# Patient Record
Sex: Male | Born: 1990 | Race: White | Hispanic: No | Marital: Single | Smoking: Never smoker
Health system: Southern US, Community
[De-identification: ages and names within clinical notes are randomized; demographics above are authoritative.]

---
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
## 2015-04-17 ENCOUNTER — Emergency Department (HOSPITAL_COMMUNITY)
Admission: EM | Admit: 2015-04-17 | Discharge: 2015-04-17 | Discharge status: Absent without leave | Payer: Self-pay | Source: Home / Self Care

## 2015-04-17 ENCOUNTER — Encounter (HOSPITAL_COMMUNITY): Payer: Self-pay | Admitting: Emergency Medicine

## 2015-04-17 ENCOUNTER — Emergency Department (HOSPITAL_COMMUNITY)
Admission: EM | Admit: 2015-04-17 | Discharge: 2015-04-17 | Disposition: A | Discharge status: Home / Self Care | Payer: 59 | Attending: Emergency Medicine | Admitting: Emergency Medicine

## 2015-04-17 ENCOUNTER — Emergency Department (HOSPITAL_COMMUNITY): Payer: 59

## 2015-04-17 DIAGNOSIS — M62830 Muscle spasm of back: Secondary | ICD-10-CM | POA: Diagnosis not present

## 2015-04-17 DIAGNOSIS — R2689 Other abnormalities of gait and mobility: Secondary | ICD-10-CM | POA: Insufficient documentation

## 2015-04-17 DIAGNOSIS — M546 Pain in thoracic spine: Secondary | ICD-10-CM | POA: Diagnosis not present

## 2015-04-17 DIAGNOSIS — M545 Low back pain, unspecified: Secondary | ICD-10-CM

## 2015-04-17 DIAGNOSIS — R200 Anesthesia of skin: Secondary | ICD-10-CM | POA: Insufficient documentation

## 2015-04-17 DIAGNOSIS — Z880 Allergy status to penicillin: Secondary | ICD-10-CM | POA: Insufficient documentation

## 2015-04-17 DIAGNOSIS — R202 Paresthesia of skin: Secondary | ICD-10-CM | POA: Insufficient documentation

## 2015-04-17 DIAGNOSIS — G8929 Other chronic pain: Secondary | ICD-10-CM | POA: Insufficient documentation

## 2015-04-17 MED ORDER — DICLOFENAC SODIUM 50 MG PO TBEC: 50.0000 mg | DELAYED_RELEASE_TABLET | Freq: Two times a day (BID) | ORAL | Status: AC

## 2015-04-17 MED ORDER — CYCLOBENZAPRINE HCL 10 MG PO TABS: 10.0000 mg | ORAL_TABLET | Freq: Two times a day (BID) | ORAL | Status: AC | PRN

## 2015-04-17 MED ORDER — CYCLOBENZAPRINE HCL 10 MG PO TABS
10.0000 mg | ORAL_TABLET | Freq: Once | ORAL | Status: AC
  Administered 2015-04-17: 10 mg via ORAL
  Filled 2015-04-17: qty 1

## 2015-04-17 NOTE — Discharge Instructions (Signed)
Your CT scan today was normal. Follow up with your doctor. If pain persist you may need to be scheduled for MRI. Do not take the muscle relaxant if driving as it will make you sleepy.

## 2015-04-17 NOTE — ED Notes (Signed)
I gave the patient a large ice pack for his back.

## 2015-04-17 NOTE — ED Notes (Addendum)
Pt reports a 3 year Hx of back pain . On Thursday Pt had a change in back pain after Physical therapy. Pt reports the new SX's are bilateral leg tingling  and worse pain  . Pt also reports new pain up to mid back . PT denies any loss of Bowel  Or  Bladder function. Pt went to his PCP ( DR SUN) . And was sent to ED for assessment.

## 2015-04-17 NOTE — ED Provider Notes (Signed)
CSN: [id number]     Arrival date & time 04/17/15  1358 History  This chart was scribed for non-physician practitioner Kerrie Buffalo, NP working with , Doug Sou, MD by Lyndel Safe, ED Scribe. This patient was seen in room TR07C/TR07C and the patient's care was started at 3:29 PM.   Chief Complaint  Patient presents with  . Back Pain   Patient is a 24 y.o. male presenting with back pain. The history is provided by the patient. No language interpreter was used.  Back Pain Location:  Thoracic spine and lumbar spine Quality:  Stabbing Radiates to:  L thigh, R thigh, L knee and R knee Pain severity:  Moderate Pain is:  Same all the time Onset quality:  Sudden Duration:  5 days Timing:  Constant Progression:  Worsening Chronicity:  New Context: physical stress   Relieved by:  Nothing Worsened by:  Ambulation and movement Ineffective treatments:  Ibuprofen and narcotics Associated symptoms: numbness   Associated symptoms: no dysuria    HPI Comments: Collin Wallace is a 24 y.o. male, with a PMhx of chronic back pain, who presents to the Emergency Department with mother complaining of constant, sharp,needle-like mid to lower back pain onset 5 days ago Pt reports after doing new hip flexion exercises at physical therapy on thursday he has experienced worsening back pain, noting he could not get out of the bed on Friday, with associated BLE tingling and numbness. Pt states the pain is exacerbated on movement, certain positions, and ambulation. 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 He describes the new pain as a sharp, needle like pain that is in his bilateral mid to lower back as opposed to his back pain at baseline that is a numb,dull pain in either one side or the other. He was seen by Urgent Care 2 days ago and prescribed ibuprofen, he has also been taking hydrocodone both with no relief. He was seen by his PCP  Dr. Wynelle Link pta who advised him to come to the ED for further evaluation.The pt has been seen by a chiropractor when he had an unremarkable MRI, orthopedist, and 2 physical therapist for his chronic back pain. Mom notes pt had osteomyelitis as an infant and that doctors advised her it may result in an abnormal gait. Denies loss of bowel or bladder incontinence, or any urinary symptoms.   History reviewed. No pertinent past medical history. History reviewed. No pertinent past surgical history. History reviewed. No pertinent family history. History  Substance Use Topics  . Smoking status: Never Smoker   . Smokeless tobacco: Not on file  . Alcohol Use: No    Review of Systems  Genitourinary: Negative for dysuria, urgency, frequency and difficulty urinating.  Musculoskeletal: Positive for back pain and arthralgias.  Neurological: Positive for numbness.  All other systems reviewed and are negative.  Allergies  Penicillins  Home Medications   Prior to Admission medications   Medication Sig Start Date End Date Taking? Authorizing Provider  cyclobenzaprine (FLEXERIL) 10 MG tablet Take 1 tablet (10 mg total) by mouth 2 (two) times daily as needed for muscle spasms. 04/17/15   Sherryl Valido Orlene Och, NP  diclofenac (VOLTAREN) 50 MG EC tablet Take 1 tablet (50 mg total) by mouth 2 (two) times daily. 04/17/15   Lyliana Dicenso Orlene Och, NP   BP 126/90 mmHg  Pulse 91  Temp(Src) 98.5 F (36.9 C) (Oral)  Resp 14  Ht 6\' 2"  (1.88 m)  Wt 145 lb (65.772 kg)  BMI 18.61 kg/m2  SpO2 100% Physical Exam  Constitutional: He is oriented to person, place, and time. He appears well-developed and well-nourished. No distress.  HENT:  Head: Normocephalic and atraumatic.  Right Ear: External ear normal.  Left Ear: External ear normal.  Eyes: Conjunctivae and EOM are normal. No scleral icterus.  Neck: Normal range of motion. Neck supple.  Cardiovascular: Normal rate, regular rhythm and normal heart sounds.   Pulmonary/Chest:  Effort normal and breath sounds normal. No respiratory distress. He has no wheezes. He has no rales.  Abdominal: Soft. Bowel sounds are normal. There is no tenderness.  Musculoskeletal: Normal range of motion. He exhibits no edema.       Lumbar back: He exhibits tenderness, pain and spasm. He exhibits no deformity and normal pulse. Decreased range of motion: due to pain.       Back:  Neurological: He is alert and oriented to person, place, and time. He has normal strength. No cranial nerve deficit or sensory deficit.  Reflex Scores:      Bicep reflexes are 2+ on the right side and 2+ on the left side.      Brachioradialis reflexes are 2+ on the right side and 2+ on the left side.      Patellar reflexes are 2+ on the right side and 2+ on the left side.      Achilles reflexes are 2+ on the right side and 2+ on the left side. Slight gait deficit noted with right foot which patient's mother states is no new and due to osteomyelitis as a child.    Skin: Skin is warm and dry. No rash noted. No erythema. No pallor.  Psychiatric: He has a normal mood and affect. His behavior is normal.  Nursing note and vitals reviewed.   ED Course  Procedures  DIAGNOSTIC STUDIES: Oxygen Saturation is 100% on RA, normal by my interpretation.    COORDINATION OF CARE: 3:39 PM Discussed treatment plan which includes to order CT lumbar spine. Will also order Flexeril.  Pt and mother acknowledge and agree to plan.   Labs Review Labs Reviewed - No data to display  Imaging Review Ct Lumbar Spine Wo Contrast  04/17/2015   CLINICAL DATA:  Severe back pain since physical therapy last Thursday. Back injury at work.  EXAM: CT LUMBAR SPINE WITHOUT CONTRAST  TECHNIQUE: Multidetector CT imaging of the lumbar spine was performed without intravenous contrast administration. Multiplanar CT image reconstructions were also generated.  COMPARISON:  Lumbar spine radiographs 10/03/2009  FINDINGS: Normal alignment of the lumbar  vertebral bodies. No acute bony findings or bone lesions. Disc spaces are maintained. The facets are normally aligned. No pars defects.  The spinal canal is generous. No disc protrusions, spinal or foraminal stenosis.  The visualized bony pelvis appears normal. The SI joints are normal.  IMPRESSION: Unremarkable lumbar spine CT examination. If symptoms persist MR may be helpful for further evaluation.   Electronically Signed   By: Rudie Meyer M.D.   On: 04/17/2015 17:50     MDM  24 y.o. male with acute on chronic low back pain that started during PT with new exercises of low back and hips. Stable for d/c to follow up with [facility] as planned. Discussed with the patient and his mother clinical and CT findings and plan of care all questioned fully answered. He will return if any problems arise. Will start Flexeril for  muscle spasm and NSAIDS.   Final diagnoses:  Acute exacerbation of chronic low back pain   I personally performed the services described in this documentation, which was scribed in my presence. The recorded information has been reviewed and is accurate.    [address] [place], [place] 04/17/15 2109  Doug Sou, MD 04/17/15 [phone]

## 2015-04-17 NOTE — ED Notes (Signed)
Pt sts lower back pain that is chronic in nature; pt sts some tingling and pain down legs which is new over last week

## 2017-06-03 ENCOUNTER — Other Ambulatory Visit: Payer: Self-pay | Admitting: Urgent Care

## 2017-06-03 ENCOUNTER — Ambulatory Visit
Admission: RE | Admit: 2017-06-03 | Discharge: 2017-06-03 | Disposition: A | Discharge status: Home / Self Care | Payer: 59 | Source: Ambulatory Visit | Attending: Urgent Care | Admitting: Urgent Care

## 2017-06-03 DIAGNOSIS — M25531 Pain in right wrist: Secondary | ICD-10-CM

## 2018-07-13 ENCOUNTER — Other Ambulatory Visit: Payer: Self-pay

## 2018-07-13 ENCOUNTER — Encounter (HOSPITAL_COMMUNITY): Payer: Self-pay | Admitting: Emergency Medicine

## 2018-07-13 ENCOUNTER — Emergency Department (HOSPITAL_COMMUNITY): Payer: 59

## 2018-07-13 ENCOUNTER — Emergency Department (HOSPITAL_COMMUNITY)
Admission: EM | Admit: 2018-07-13 | Discharge: 2018-07-13 | Disposition: A | Discharge status: Home / Self Care | Payer: 59 | Attending: Emergency Medicine | Admitting: Emergency Medicine

## 2018-07-13 DIAGNOSIS — R0602 Shortness of breath: Secondary | ICD-10-CM | POA: Diagnosis present

## 2018-07-13 DIAGNOSIS — J181 Lobar pneumonia, unspecified organism: Secondary | ICD-10-CM | POA: Diagnosis not present

## 2018-07-13 DIAGNOSIS — J189 Pneumonia, unspecified organism: Secondary | ICD-10-CM

## 2018-07-13 LAB — I-STAT CG4 LACTIC ACID, ED
LACTIC ACID, VENOUS: 2.67 mmol/L — AB (ref 0.5–1.9)
Lactic Acid, Venous: 1.8 mmol/L (ref 0.5–1.9)

## 2018-07-13 LAB — CBC WITH DIFFERENTIAL/PLATELET
BASOS ABS: 0 10*3/uL (ref 0.0–0.1)
BASOS PCT: 0 %
Eosinophils Absolute: 0 10*3/uL (ref 0.0–0.7)
Eosinophils Relative: 0 %
HCT: 42.1 % (ref 39.0–52.0)
Hemoglobin: 15.5 g/dL (ref 13.0–17.0)
Lymphocytes Relative: 44 %
Lymphs Abs: 3.1 10*3/uL (ref 0.7–4.0)
MCH: 31.6 pg (ref 26.0–34.0)
MCHC: 36.8 g/dL — ABNORMAL HIGH (ref 30.0–36.0)
MCV: 85.7 fL (ref 78.0–100.0)
MONO ABS: 0.3 10*3/uL (ref 0.1–1.0)
Monocytes Relative: 4 %
Neutro Abs: 3.7 10*3/uL (ref 1.7–7.7)
Neutrophils Relative %: 52 %
Platelets: 350 10*3/uL (ref 150–400)
RBC: 4.91 MIL/uL (ref 4.22–5.81)
RDW: 11.1 % — AB (ref 11.5–15.5)
WBC: 7.1 10*3/uL (ref 4.0–10.5)

## 2018-07-13 LAB — COMPREHENSIVE METABOLIC PANEL
ALBUMIN: 4.2 g/dL (ref 3.5–5.0)
ALT: 20 U/L (ref 0–44)
AST: 29 U/L (ref 15–41)
Alkaline Phosphatase: 66 U/L (ref 38–126)
Anion gap: 13 (ref 5–15)
BUN: 11 mg/dL (ref 6–20)
CHLORIDE: 103 mmol/L (ref 98–111)
CO2: 24 mmol/L (ref 22–32)
Calcium: 9.8 mg/dL (ref 8.9–10.3)
Creatinine, Ser: 1.17 mg/dL (ref 0.61–1.24)
GFR calc Af Amer: >60 mL/min (ref >60)
GFR calc non Af Amer: >60 mL/min (ref >60)
Glucose, Bld: 133 mg/dL — ABNORMAL HIGH (ref 70–99)
POTASSIUM: 3.4 mmol/L — AB (ref 3.5–5.1)
SODIUM: 140 mmol/L (ref 135–145)
Total Bilirubin: 0.9 mg/dL (ref 0.3–1.2)
Total Protein: 7.3 g/dL (ref 6.5–8.1)

## 2018-07-13 MED ORDER — IOPAMIDOL (ISOVUE-370) INJECTION 76%
100.0000 mL | Freq: Once | INTRAVENOUS | Status: AC | PRN
  Administered 2018-07-13: 100 mL via INTRAVENOUS

## 2018-07-13 MED ORDER — IOPAMIDOL (ISOVUE-370) INJECTION 76%
INTRAVENOUS | Status: AC
  Filled 2018-07-13: qty 100

## 2018-07-13 MED ORDER — BENZONATATE 100 MG PO CAPS: 100.0000 mg | ORAL_CAPSULE | Freq: Three times a day (TID) | ORAL | 0 refills | Status: AC

## 2018-07-13 MED ORDER — IPRATROPIUM-ALBUTEROL 0.5-2.5 (3) MG/3ML IN SOLN
3.0000 mL | Freq: Once | RESPIRATORY_TRACT | Status: AC
  Administered 2018-07-13: 3 mL via RESPIRATORY_TRACT
  Filled 2018-07-13: qty 3

## 2018-07-13 MED ORDER — SODIUM CHLORIDE 0.9 % IV BOLUS
1000.0000 mL | Freq: Once | INTRAVENOUS | Status: AC
  Administered 2018-07-13: 1000 mL via INTRAVENOUS

## 2018-07-13 MED ORDER — CEFDINIR 300 MG PO CAPS: 300.0000 mg | ORAL_CAPSULE | Freq: Two times a day (BID) | ORAL | 0 refills | Status: AC

## 2018-07-13 NOTE — ED Notes (Signed)
Patient transported to X-ray 

## 2018-07-13 NOTE — ED Notes (Signed)
Patient transported to CT 

## 2018-07-13 NOTE — ED Notes (Signed)
Pt tolerated PO challenge

## 2018-07-13 NOTE — ED Provider Notes (Signed)
[facility]-EMERGENCY DEPT Provider Note   CSN: [id number] Arrival date & time: 07/13/18  1936     History   Chief Complaint Chief Complaint  Patient presents with  . Shortness of Breath    HPI Collin Wallace is a 27 y.o. male who presents today for evaluation of shortness of breath.  Over the past 16 days he has reportedly had worsening shortness of breath.  He does not have any chest pain.  He reports that he went to his PCP and was given a Z-pack which he took with out improvement.  He has not had a chest x-ray yet, reports that his provider listened to the right lower lobe and thought he had a pneumonia.  He has a mild history of asthma, got an inhaler today that he used twice with out relief.  He denies fever.  He has no leg swelling.  He reports that he has been using juul to vape mint flavored tobacco for the past six month.  He is otherwise healthy.  He had a negative flu test at his PCP.    He has never had shortness of breath like this before.  He denies any recent steroid use.   He says that he has been getting very short of breath with walking, and has to stop because he almost passes out when walking.    HPI  History reviewed. No pertinent past medical history.  There are no active problems to display for this patient.   History reviewed. No pertinent surgical history.      Home Medications    Prior to Admission medications   Medication Sig Start Date End Date Taking? Authorizing Provider  HYDROcodone-homatropine (HYCODAN) 5-1.5 MG/5ML syrup Take 5 mLs by mouth 4 (four) times daily as needed for cough. 07/09/18  Yes [provider]  ibuprofen (ADVIL,MOTRIN) 200 MG tablet Take 200 mg by mouth 2 (two) times daily as needed for fever or headache.   Yes [provider]  VENTOLIN HFA 108 (90 Base) MCG/ACT inhaler Inhale 2 puffs into the lungs. Every 4 too 6 hours as needed for wheezing or SOB 07/13/18  Yes [provider]  benzonatate (TESSALON) 100 MG capsule Take 1 capsule (100 mg total) by mouth every 8 (eight) hours. 07/13/18   Cristina Gong, PA-C  cefdinir (OMNICEF) 300 MG capsule Take 1 capsule (300 mg total) by mouth 2 (two) times daily for 10 days. 07/13/18 07/23/18  Cristina Gong, PA-C  cyclobenzaprine (FLEXERIL) 10 MG tablet Take 1 tablet (10 mg total) by mouth 2 (two) times daily as needed for muscle spasms. Patient not taking: Reported on 07/13/2018 04/17/15   Janne Napoleon, NP  diclofenac (VOLTAREN) 50 MG EC tablet Take 1 tablet (50 mg total) by mouth 2 (two) times daily. Patient not taking: Reported on 07/13/2018 04/17/15   Janne Napoleon, NP    Family History No family history on file.  Social History Social History   Tobacco Use  . Smoking status: Never Smoker  Substance Use Topics  . Alcohol use: No  . Drug use: No     Allergies   Penicillins and Sulfa antibiotics   Review of Systems Review of Systems  Constitutional: Negative for chills and fever.  Respiratory: Positive for cough (Occasional, occasionall productive. ) and shortness of breath.   Cardiovascular: Negative for chest pain, palpitations and leg swelling.  Gastrointestinal: Negative for abdominal pain, nausea and vomiting.  Neurological: Positive for light-headedness (When  walking). Negative for headaches.     Physical Exam Updated Vital Signs BP 113/83 (BP Location: Right Arm)   Pulse 74   Temp 98 F (36.7 C) (Oral)   Resp 14   Ht 6' (1.829 m)   Wt 66.2 kg   SpO2 97%   BMI 19.80 kg/m   Physical Exam  Constitutional: He appears well-developed and well-nourished. No distress.  HENT:  Head: Normocephalic and atraumatic.  Eyes: Conjunctivae are normal. Right eye exhibits no discharge. Left eye exhibits no discharge. No scleral icterus.  Neck: Normal range of motion.  Cardiovascular: Normal rate and regular rhythm.  Pulmonary/Chest: Effort normal. No stridor. Tachypnea noted. No respiratory  distress.  Abdominal: He exhibits no distension.  Musculoskeletal: He exhibits no edema or deformity.  Neurological: He is alert. He exhibits normal muscle tone.  Skin: Skin is warm and dry. He is not diaphoretic.  Psychiatric: He has a normal mood and affect. His behavior is normal.  Nursing note and vitals reviewed.    ED Treatments / Results  Labs (all labs ordered are listed, but only abnormal results are displayed) Labs Reviewed  CBC WITH DIFFERENTIAL/PLATELET - Abnormal; Notable for the following components:      Result Value   MCHC 36.8 (*)    RDW 11.1 (*)    All other components within normal limits  COMPREHENSIVE METABOLIC PANEL - Abnormal; Notable for the following components:   Potassium 3.4 (*)    Glucose, Bld 133 (*)    All other components within normal limits  I-STAT CG4 LACTIC ACID, ED - Abnormal; Notable for the following components:   Lactic Acid, Venous 2.67 (*)    All other components within normal limits  CULTURE, BLOOD (ROUTINE X 2)  CULTURE, BLOOD (ROUTINE X 2)  I-STAT CG4 LACTIC ACID, ED  I-STAT CG4 LACTIC ACID, ED    EKG EKG Interpretation  Date/Time:  Tuesday July 13 2018 19:53:33 EDT Ventricular Rate:  108 PR Interval:    QRS Duration: 84 QT Interval:  328 QTC Calculation: 440 R Axis:   95 Text Interpretation:  Sinus tachycardia Ventricular premature complex Borderline right axis deviation Borderline T wave abnormalities Borderline ST elevation, anterior leads Confirmed by Kennis Carina [phone]) on 07/13/2018 8:15:16 PM   Radiology Dg Chest 2 View  Result Date: 07/13/2018 CLINICAL DATA:  Cough, dyspnea EXAM: CHEST - 2 VIEW COMPARISON:  None. FINDINGS: Normal heart size. Normal mediastinal contour. No pneumothorax. No pleural effusion. Lungs appear clear, with no acute consolidative airspace disease and no pulmonary edema. IMPRESSION: No active cardiopulmonary disease. Electronically Signed   By: Delbert Phenix M.D.   On: 07/13/2018 20:47    Ct Angio Chest Pe W/cm &/or Wo Cm  Result Date: 07/13/2018 CLINICAL DATA:  Shortness of breath.  Recent diagnosis of pneumonia. EXAM: CT ANGIOGRAPHY CHEST WITH CONTRAST TECHNIQUE: Multidetector CT imaging of the chest was performed using the standard protocol during bolus administration of intravenous contrast. Multiplanar CT image reconstructions and MIPs were obtained to evaluate the vascular anatomy. CONTRAST:  100 mL Isovue 370 COMPARISON:  Chest radiograph 07/13/2018 FINDINGS: Cardiovascular: --Pulmonary arteries: Contrast injection is sufficient to demonstrate satisfactory opacification of the pulmonary arteries to the segmental level. There is no pulmonary embolus. The main pulmonary artery is within normal limits for size. --Aorta: Limited opacification of the aorta due to bolus timing optimization for the pulmonary arteries. Conventional 3 vessel aortic branching pattern. The aortic course and caliber are normal. There is no aortic atherosclerosis. --Heart: Normal size.  No pericardial effusion. Mediastinum/Nodes: No mediastinal, hilar or axillary lymphadenopathy. The visualized thyroid and thoracic esophageal course are unremarkable. Lungs/Pleura: No pulmonary nodules or masses. No pleural effusion or pneumothorax. There is an area of faint opacity in the left lower lobe (series 7, image 93). No large consolidation. No focal pleural abnormality. Upper Abdomen: Contrast bolus timing is not optimized for evaluation of the abdominal organs. Within this limitation, the visualized organs of the upper abdomen are normal. Musculoskeletal: No chest wall abnormality. No acute or significant osseous findings. Review of the MIP images confirms the above findings. IMPRESSION: 1. No pulmonary embolus. 2. Faint opacity in the left lower lobe, possibly indicating infection. Lungs are otherwise clear. Electronically Signed   By: Deatra RobinsonKevin  Herman M.D.   On: 07/13/2018 22:06    Procedures Procedures (including  critical care time)  Medications Ordered in ED Medications  ipratropium-albuterol (DUONEB) 0.5-2.5 (3) MG/3ML nebulizer solution 3 mL (3 mLs Nebulization Given 07/13/18 2058)  sodium chloride 0.9 % bolus 1,000 mL (0 mLs Intravenous Stopped 07/13/18 2218)  iopamidol (ISOVUE-370) 76 % injection 100 mL (100 mLs Intravenous Contrast Given 07/13/18 2148)     Initial Impression / Assessment and Plan / ED Course  I have reviewed the triage vital signs and the nursing notes.  Pertinent labs & imaging results that were available during my care of the patient were reviewed by me and considered in my medical decision making (see chart for details).    Patient presents today for evaluation of shortness of breath.  He was recently diagnosed with pneumonia and finished azithromycin, however over the past few days has become significantly worse.  He does report using a vape pen.  CT chest obtained without evidence of PE, obstruction, lung mass, concern for small left lower lobe pneumonia.  Patient was given IV fluids, and a duo neb after which he had a significant improvement in his symptoms, was no longer tachypnic, tachycardic.  He was able to ambulate with out desaturating or becoming short of breath or otherwise symptomatic.  His initial lactic was mildly elevated, however I suspect that this is primarily secondary to tachypnea, rather than sepsis.  Blood cultures were obtained on initial evaluation.    He does have an allergy to PCN listed, this was after he was on sulfa and amoxicillin as a child, and developed a small rash after being on it for 6 months.  He had no anaphylaxis, no mucus membrane involvement.  Discussed with patient and his mother the possible options for treatment including flouroquinolones, who elected for omnicef.  He was given first dose while in the ED.  His labs did not show leukocytosis, or anemia.  He denied any complaints at the time of discharge, stated that he felt much better and  wished to eat.    Return precautions were discussed with patient who states their understanding.  This patient was seen as a shared visit with Dr. Pilar PlateBero.  He has an albuterol inhaler at home and knows how to use it.  At the time of discharge patient denied any unaddressed complaints or concerns.  Patient is agreeable for discharge home.    Final Clinical Impressions(s) / ED Diagnoses   Final diagnoses:  Community acquired pneumonia of left lower lobe of lung (HCC)  SOB (shortness of breath)    ED Discharge Orders         Ordered    cefdinir (OMNICEF) 300 MG capsule  2 times daily     07/13/18 2330  benzonatate (TESSALON) 100 MG capsule  Every 8 hours     07/13/18 2330           Cristina Gong, PA-C 07/13/18 2356    Sabas Sous, MD 07/14/18 [phone]

## 2018-07-13 NOTE — ED Notes (Signed)
Bed: WA17 Expected date:  Expected time:  Means of arrival:  Comments: TRIAGE 2

## 2018-07-13 NOTE — ED Notes (Signed)
Will obtain remaining bloodwork once patient returns from [facility].

## 2018-07-13 NOTE — ED Notes (Signed)
ED Provider at bedside. 

## 2018-07-13 NOTE — Discharge Instructions (Addendum)
Please use your inhaler as needed.  Please make sure you are staying well hydrated.  Your CAT scan showed concern for a small left lower lobe pneumonia.  As we discussed you are being started on Omnicef also called Cefdinir for treatment of this.  We also discussed other treatment options, however you elected to proceed with this.  Your CT scan did not show any evidence of a blood clot, or abnormalities other than the possible left lower lobe pneumonia.  Please do not restart vaping or smoking.    You have blood cultures pending.  If these have bacteria you will be called to return to the ER for antibiotics.  Please follow up with your doctor in the next 1-2 days, or back at the ER sooner if your symptoms worsen.

## 2018-07-13 NOTE — ED Notes (Signed)
PA aware of elevated I-stat lactic results.

## 2018-07-13 NOTE — ED Triage Notes (Signed)
Patient reports productive cough x10 days. Reports seen at clinic and dx with virus, seen at PCP and dx with pneumonia. Reports taking abx as prescribed. C/o SOB since Sunday. Had inhaler called in today and used with no relief. Hx asthma. Denies fevers.

## 2018-07-13 NOTE — ED Notes (Signed)
Ambulated Pt down hallway. Pt stayed at 98% on room air. Pt denied any shortness of breath. Notified [place], [place]

## 2018-07-19 LAB — CULTURE, BLOOD (ROUTINE X 2)
Culture: NO GROWTH
Culture: NO GROWTH
Special Requests: ADEQUATE
Special Requests: ADEQUATE

## 2019-12-12 IMAGING — CT CT ANGIO CHEST
2 of 6 series · 19 of 46 positions shown · IV contrast (ISOVUE)
Comparison: Chest radiograph 07/13/2018

CLINICAL DATA: Shortness of breath.  Recent diagnosis of pneumonia.

EXAM:
CT ANGIOGRAPHY CHEST WITH CONTRAST
TECHNIQUE: Multidetector CT imaging of the chest was performed using the
standard protocol during bolus administration of intravenous
contrast. Multiplanar CT image reconstructions and MIPs were
obtained to evaluate the vascular anatomy.
CONTRAST:  100 mL Isovue 370

[Series 6: thins · axial · 0.70mm/px · z∈[+1462,+1733]mm · 16 of 297 slices shown]
[im 13/297  lung]
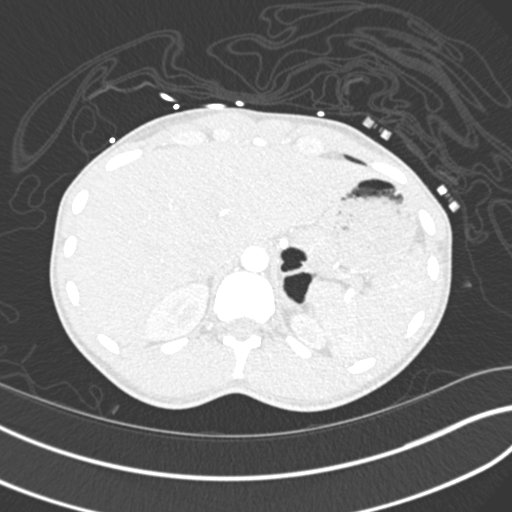
[im 39/297  soft-tissue]
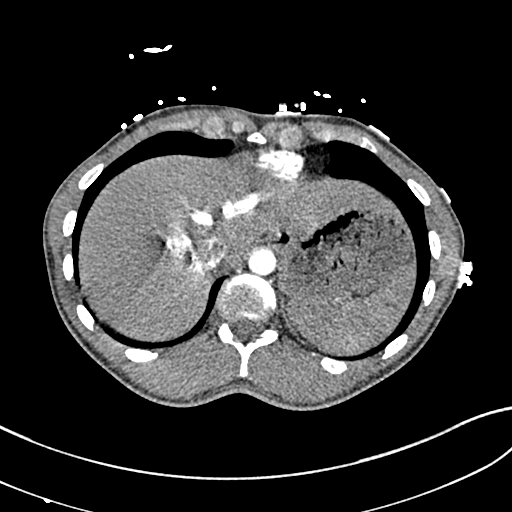
[im 52/297  lung]
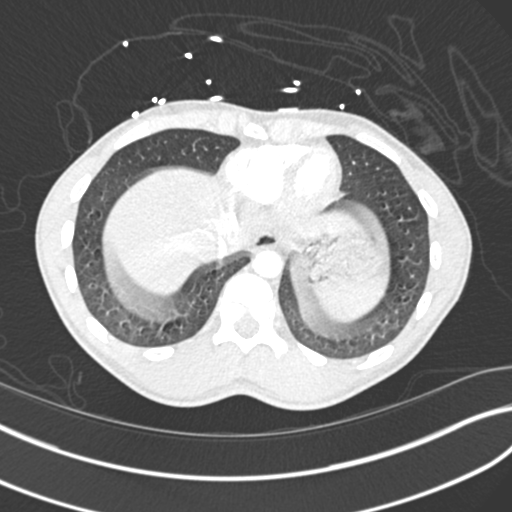
[im 65/297  soft-tissue]
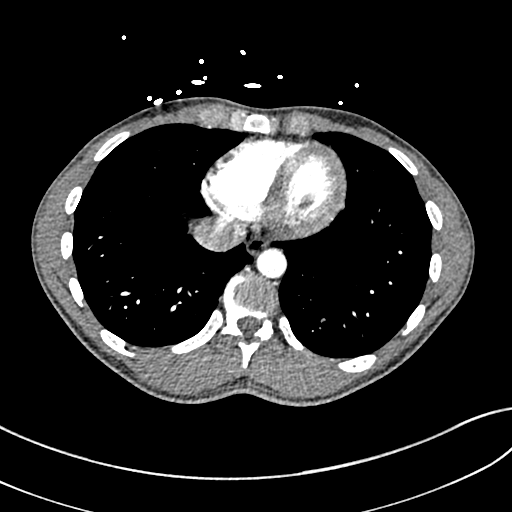
[im 91/297  lung]
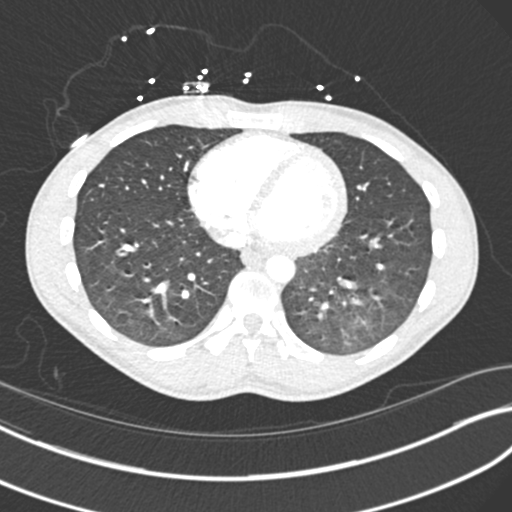
[im 103/297  soft-tissue]
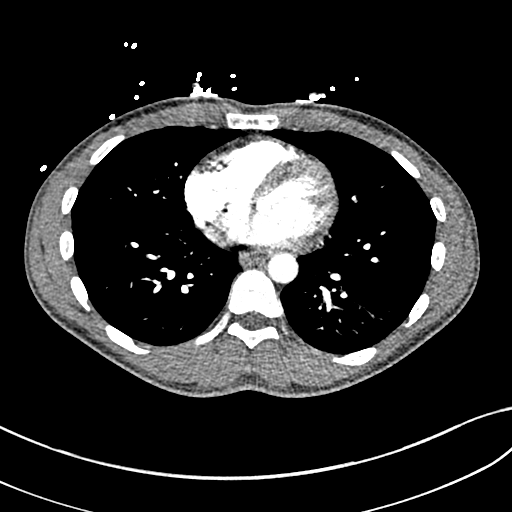
[im 116/297  lung]
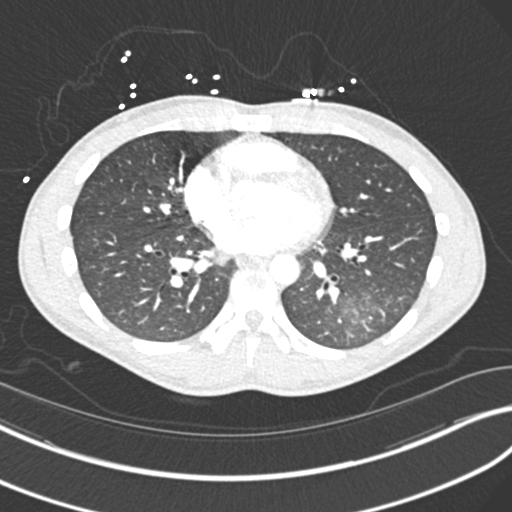
[im 142/297  soft-tissue]
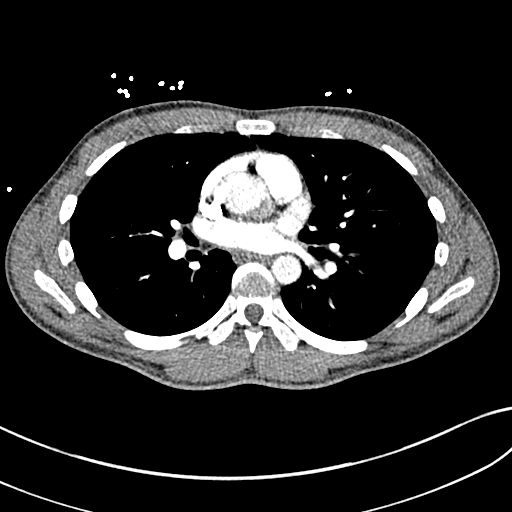
[im 155/297  lung]
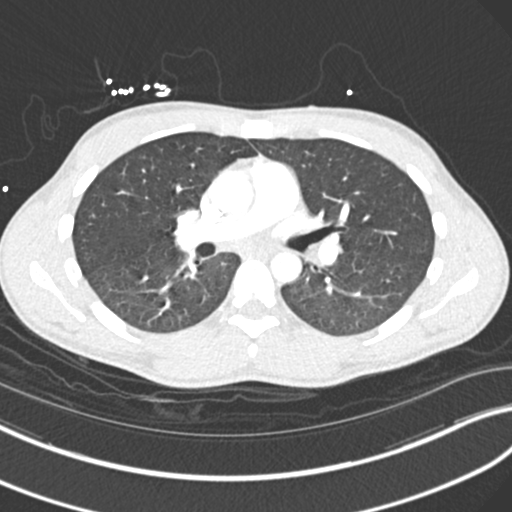
[im 181/297  soft-tissue]
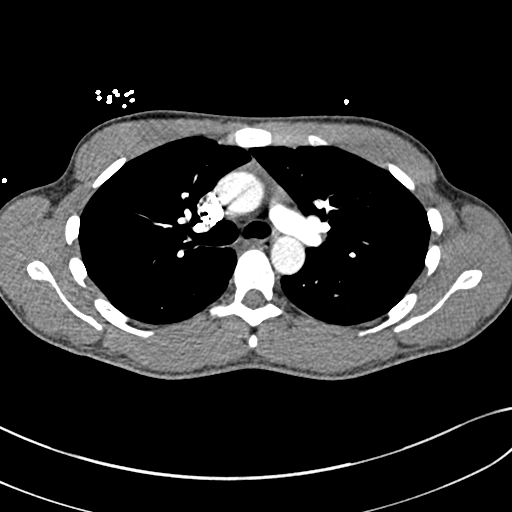
[im 194/297  lung]
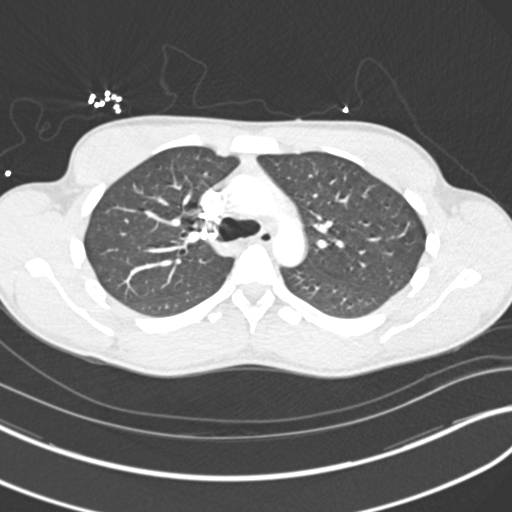
[im 206/297  soft-tissue]
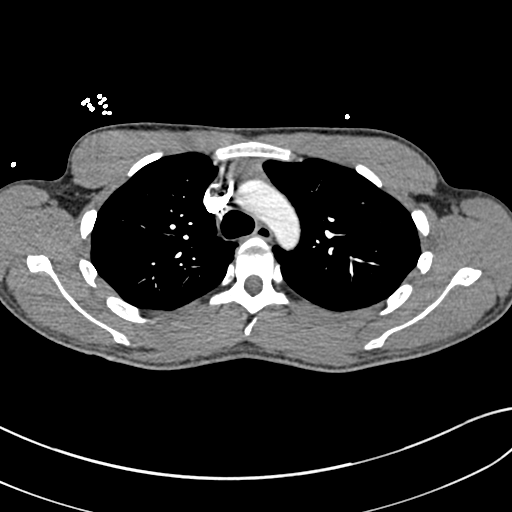
[im 232/297  lung]
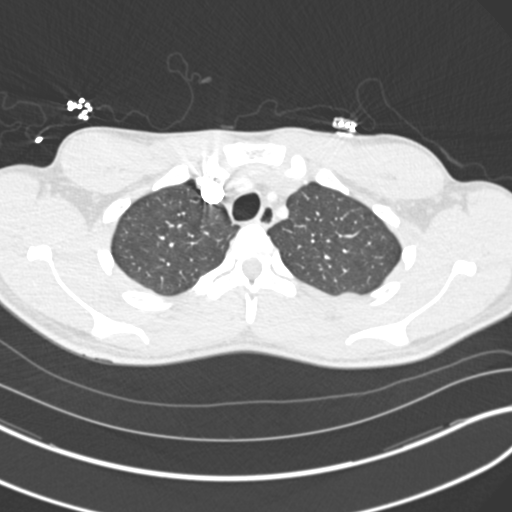
[im 245/297  soft-tissue]
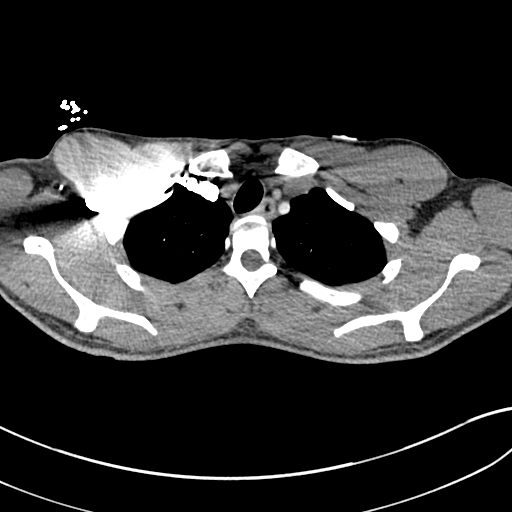
[im 258/297  lung]
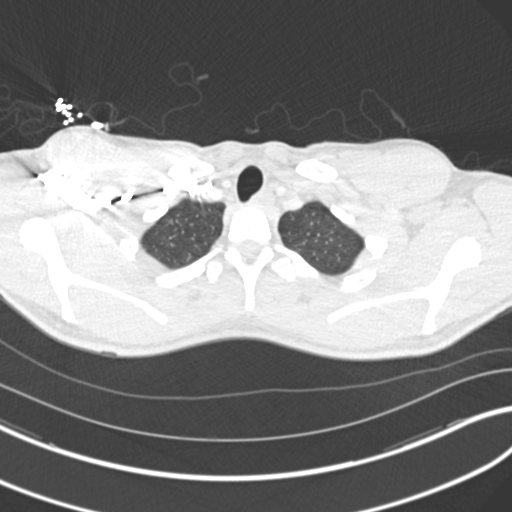
[im 284/297  soft-tissue]
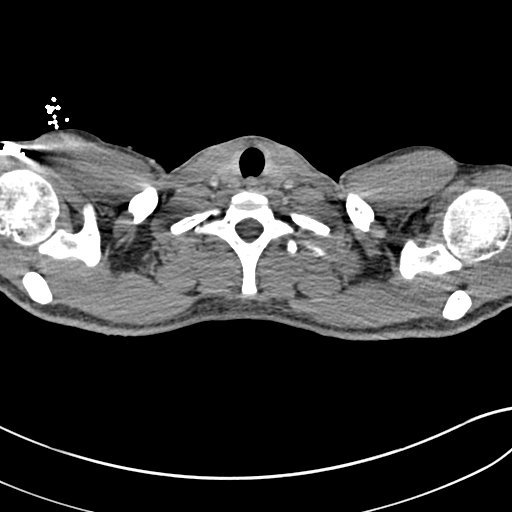

[Series 8: coronal mpr · coronal · 0.60mm/px · 3 of 110 slices shown]
[im 28/110  soft-tissue]
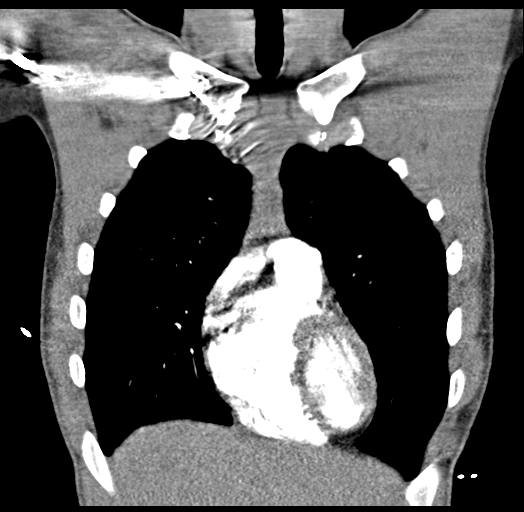
[im 55/110  soft-tissue]
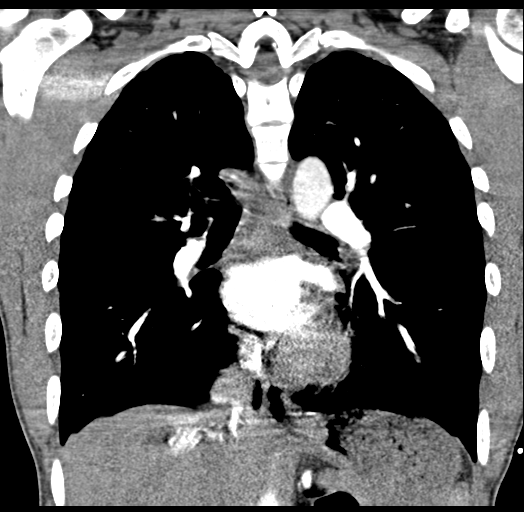
[im 82/110  soft-tissue]
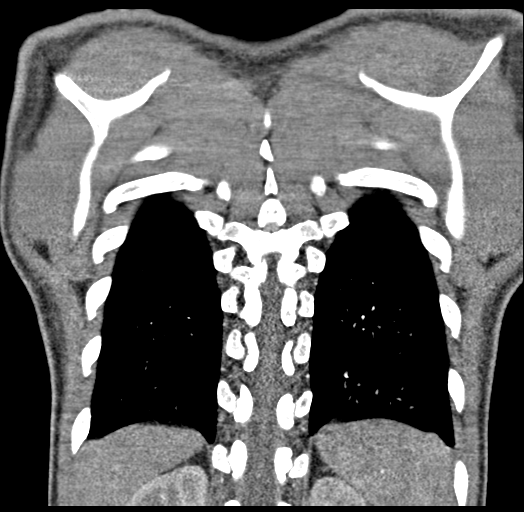

[19 of 46 positions shown; findings below may reference images not displayed]

FINDINGS: Cardiovascular:

--Pulmonary arteries: Contrast injection is sufficient to
demonstrate satisfactory opacification of the pulmonary arteries to
the segmental level. There is no pulmonary embolus. The main
pulmonary artery is within normal limits for size.

--Aorta: Limited opacification of the aorta due to bolus timing
optimization for the pulmonary arteries. Conventional 3 vessel
aortic branching pattern. The aortic course and caliber are normal.
There is no aortic atherosclerosis.

--Heart: Normal size. No pericardial effusion.

Mediastinum/Nodes: No mediastinal, hilar or axillary
lymphadenopathy. The visualized thyroid and thoracic esophageal
course are unremarkable.

Lungs/Pleura: No pulmonary nodules or masses. No pleural effusion or
pneumothorax. There is an area of faint opacity in the left lower
lobe (series 7, image 93). No large consolidation. No focal pleural
abnormality.

Upper Abdomen: Contrast bolus timing is not optimized for evaluation
of the abdominal organs. Within this limitation, the visualized
organs of the upper abdomen are normal.

Musculoskeletal: No chest wall abnormality. No acute or significant
osseous findings.

Review of the MIP images confirms the above findings.
IMPRESSION: 1. No pulmonary embolus.
2. Faint opacity in the left lower lobe, possibly indicating
infection. Lungs are otherwise clear.

## 2020-01-03 DIAGNOSIS — D2239 Melanocytic nevi of other parts of face: Secondary | ICD-10-CM | POA: Diagnosis not present

## 2020-04-04 DIAGNOSIS — Z03818 Encounter for observation for suspected exposure to other biological agents ruled out: Secondary | ICD-10-CM | POA: Diagnosis not present

## 2020-04-04 DIAGNOSIS — M791 Myalgia, unspecified site: Secondary | ICD-10-CM | POA: Diagnosis not present

## 2020-04-04 DIAGNOSIS — R5383 Other fatigue: Secondary | ICD-10-CM | POA: Diagnosis not present

## 2020-05-10 DIAGNOSIS — F1123 Opioid dependence with withdrawal: Secondary | ICD-10-CM | POA: Diagnosis not present

## 2020-05-10 DIAGNOSIS — F329 Major depressive disorder, single episode, unspecified: Secondary | ICD-10-CM | POA: Diagnosis not present

## 2020-05-10 DIAGNOSIS — F17200 Nicotine dependence, unspecified, uncomplicated: Secondary | ICD-10-CM | POA: Diagnosis not present

## 2020-05-14 DIAGNOSIS — F329 Major depressive disorder, single episode, unspecified: Secondary | ICD-10-CM | POA: Diagnosis not present

## 2020-05-14 DIAGNOSIS — F17200 Nicotine dependence, unspecified, uncomplicated: Secondary | ICD-10-CM | POA: Diagnosis not present

## 2020-05-14 DIAGNOSIS — F1123 Opioid dependence with withdrawal: Secondary | ICD-10-CM | POA: Diagnosis not present

## 2020-05-21 DIAGNOSIS — F329 Major depressive disorder, single episode, unspecified: Secondary | ICD-10-CM | POA: Diagnosis not present

## 2020-05-21 DIAGNOSIS — F1123 Opioid dependence with withdrawal: Secondary | ICD-10-CM | POA: Diagnosis not present

## 2020-05-21 DIAGNOSIS — F17200 Nicotine dependence, unspecified, uncomplicated: Secondary | ICD-10-CM | POA: Diagnosis not present

## 2020-05-28 DIAGNOSIS — F1123 Opioid dependence with withdrawal: Secondary | ICD-10-CM | POA: Diagnosis not present

## 2020-05-28 DIAGNOSIS — F329 Major depressive disorder, single episode, unspecified: Secondary | ICD-10-CM | POA: Diagnosis not present

## 2020-05-28 DIAGNOSIS — F17200 Nicotine dependence, unspecified, uncomplicated: Secondary | ICD-10-CM | POA: Diagnosis not present

## 2020-06-11 DIAGNOSIS — F329 Major depressive disorder, single episode, unspecified: Secondary | ICD-10-CM | POA: Diagnosis not present

## 2020-06-11 DIAGNOSIS — F17200 Nicotine dependence, unspecified, uncomplicated: Secondary | ICD-10-CM | POA: Diagnosis not present

## 2020-06-11 DIAGNOSIS — F1123 Opioid dependence with withdrawal: Secondary | ICD-10-CM | POA: Diagnosis not present

## 2020-06-17 DIAGNOSIS — R21 Rash and other nonspecific skin eruption: Secondary | ICD-10-CM | POA: Diagnosis not present

## 2020-06-25 DIAGNOSIS — F329 Major depressive disorder, single episode, unspecified: Secondary | ICD-10-CM | POA: Diagnosis not present

## 2020-06-25 DIAGNOSIS — F17200 Nicotine dependence, unspecified, uncomplicated: Secondary | ICD-10-CM | POA: Diagnosis not present

## 2020-06-25 DIAGNOSIS — F1123 Opioid dependence with withdrawal: Secondary | ICD-10-CM | POA: Diagnosis not present

## 2020-06-27 DIAGNOSIS — Z03818 Encounter for observation for suspected exposure to other biological agents ruled out: Secondary | ICD-10-CM | POA: Diagnosis not present

## 2020-06-27 DIAGNOSIS — R197 Diarrhea, unspecified: Secondary | ICD-10-CM | POA: Diagnosis not present

## 2020-07-09 DIAGNOSIS — F17200 Nicotine dependence, unspecified, uncomplicated: Secondary | ICD-10-CM | POA: Diagnosis not present

## 2020-07-09 DIAGNOSIS — F1123 Opioid dependence with withdrawal: Secondary | ICD-10-CM | POA: Diagnosis not present

## 2020-07-09 DIAGNOSIS — F329 Major depressive disorder, single episode, unspecified: Secondary | ICD-10-CM | POA: Diagnosis not present

## 2020-07-16 DIAGNOSIS — F1123 Opioid dependence with withdrawal: Secondary | ICD-10-CM | POA: Diagnosis not present

## 2020-07-16 DIAGNOSIS — F17200 Nicotine dependence, unspecified, uncomplicated: Secondary | ICD-10-CM | POA: Diagnosis not present

## 2020-07-16 DIAGNOSIS — F329 Major depressive disorder, single episode, unspecified: Secondary | ICD-10-CM | POA: Diagnosis not present

## 2020-07-23 DIAGNOSIS — F17200 Nicotine dependence, unspecified, uncomplicated: Secondary | ICD-10-CM | POA: Diagnosis not present

## 2020-07-23 DIAGNOSIS — F1123 Opioid dependence with withdrawal: Secondary | ICD-10-CM | POA: Diagnosis not present

## 2020-07-23 DIAGNOSIS — F329 Major depressive disorder, single episode, unspecified: Secondary | ICD-10-CM | POA: Diagnosis not present

## 2020-07-26 DIAGNOSIS — D225 Melanocytic nevi of trunk: Secondary | ICD-10-CM | POA: Diagnosis not present

## 2020-07-26 DIAGNOSIS — D2239 Melanocytic nevi of other parts of face: Secondary | ICD-10-CM | POA: Diagnosis not present

## 2020-07-30 DIAGNOSIS — F1123 Opioid dependence with withdrawal: Secondary | ICD-10-CM | POA: Diagnosis not present

## 2020-07-30 DIAGNOSIS — F329 Major depressive disorder, single episode, unspecified: Secondary | ICD-10-CM | POA: Diagnosis not present

## 2020-07-30 DIAGNOSIS — F17200 Nicotine dependence, unspecified, uncomplicated: Secondary | ICD-10-CM | POA: Diagnosis not present

## 2020-08-06 DIAGNOSIS — F1123 Opioid dependence with withdrawal: Secondary | ICD-10-CM | POA: Diagnosis not present

## 2020-08-06 DIAGNOSIS — F329 Major depressive disorder, single episode, unspecified: Secondary | ICD-10-CM | POA: Diagnosis not present

## 2020-08-06 DIAGNOSIS — F17200 Nicotine dependence, unspecified, uncomplicated: Secondary | ICD-10-CM | POA: Diagnosis not present

## 2020-08-13 DIAGNOSIS — F329 Major depressive disorder, single episode, unspecified: Secondary | ICD-10-CM | POA: Diagnosis not present

## 2020-08-13 DIAGNOSIS — F1123 Opioid dependence with withdrawal: Secondary | ICD-10-CM | POA: Diagnosis not present

## 2020-08-13 DIAGNOSIS — F17200 Nicotine dependence, unspecified, uncomplicated: Secondary | ICD-10-CM | POA: Diagnosis not present

## 2020-08-20 DIAGNOSIS — F329 Major depressive disorder, single episode, unspecified: Secondary | ICD-10-CM | POA: Diagnosis not present

## 2020-08-20 DIAGNOSIS — F17200 Nicotine dependence, unspecified, uncomplicated: Secondary | ICD-10-CM | POA: Diagnosis not present

## 2020-08-20 DIAGNOSIS — F1123 Opioid dependence with withdrawal: Secondary | ICD-10-CM | POA: Diagnosis not present

## 2020-08-27 DIAGNOSIS — F17200 Nicotine dependence, unspecified, uncomplicated: Secondary | ICD-10-CM | POA: Diagnosis not present

## 2020-08-27 DIAGNOSIS — F329 Major depressive disorder, single episode, unspecified: Secondary | ICD-10-CM | POA: Diagnosis not present

## 2020-08-27 DIAGNOSIS — F1123 Opioid dependence with withdrawal: Secondary | ICD-10-CM | POA: Diagnosis not present

## 2020-09-03 DIAGNOSIS — F1123 Opioid dependence with withdrawal: Secondary | ICD-10-CM | POA: Diagnosis not present

## 2020-09-03 DIAGNOSIS — F17200 Nicotine dependence, unspecified, uncomplicated: Secondary | ICD-10-CM | POA: Diagnosis not present

## 2020-09-03 DIAGNOSIS — F329 Major depressive disorder, single episode, unspecified: Secondary | ICD-10-CM | POA: Diagnosis not present

## 2020-09-10 DIAGNOSIS — F329 Major depressive disorder, single episode, unspecified: Secondary | ICD-10-CM | POA: Diagnosis not present

## 2020-09-10 DIAGNOSIS — F1123 Opioid dependence with withdrawal: Secondary | ICD-10-CM | POA: Diagnosis not present

## 2020-09-10 DIAGNOSIS — F17200 Nicotine dependence, unspecified, uncomplicated: Secondary | ICD-10-CM | POA: Diagnosis not present

## 2020-09-17 DIAGNOSIS — F329 Major depressive disorder, single episode, unspecified: Secondary | ICD-10-CM | POA: Diagnosis not present

## 2020-09-17 DIAGNOSIS — F1123 Opioid dependence with withdrawal: Secondary | ICD-10-CM | POA: Diagnosis not present

## 2020-09-17 DIAGNOSIS — F17200 Nicotine dependence, unspecified, uncomplicated: Secondary | ICD-10-CM | POA: Diagnosis not present

## 2020-09-24 DIAGNOSIS — F329 Major depressive disorder, single episode, unspecified: Secondary | ICD-10-CM | POA: Diagnosis not present

## 2020-09-24 DIAGNOSIS — F17200 Nicotine dependence, unspecified, uncomplicated: Secondary | ICD-10-CM | POA: Diagnosis not present

## 2020-09-24 DIAGNOSIS — F1123 Opioid dependence with withdrawal: Secondary | ICD-10-CM | POA: Diagnosis not present

## 2020-10-08 DIAGNOSIS — F17200 Nicotine dependence, unspecified, uncomplicated: Secondary | ICD-10-CM | POA: Diagnosis not present

## 2020-10-08 DIAGNOSIS — F329 Major depressive disorder, single episode, unspecified: Secondary | ICD-10-CM | POA: Diagnosis not present

## 2020-10-08 DIAGNOSIS — F1123 Opioid dependence with withdrawal: Secondary | ICD-10-CM | POA: Diagnosis not present

## 2020-10-15 DIAGNOSIS — F17200 Nicotine dependence, unspecified, uncomplicated: Secondary | ICD-10-CM | POA: Diagnosis not present

## 2020-10-15 DIAGNOSIS — F329 Major depressive disorder, single episode, unspecified: Secondary | ICD-10-CM | POA: Diagnosis not present

## 2020-10-15 DIAGNOSIS — F1123 Opioid dependence with withdrawal: Secondary | ICD-10-CM | POA: Diagnosis not present

## 2020-10-29 DIAGNOSIS — F17200 Nicotine dependence, unspecified, uncomplicated: Secondary | ICD-10-CM | POA: Diagnosis not present

## 2020-10-29 DIAGNOSIS — F1123 Opioid dependence with withdrawal: Secondary | ICD-10-CM | POA: Diagnosis not present

## 2020-10-29 DIAGNOSIS — F329 Major depressive disorder, single episode, unspecified: Secondary | ICD-10-CM | POA: Diagnosis not present

## 2020-10-29 DIAGNOSIS — F419 Anxiety disorder, unspecified: Secondary | ICD-10-CM | POA: Diagnosis not present

## 2020-11-05 DIAGNOSIS — F329 Major depressive disorder, single episode, unspecified: Secondary | ICD-10-CM | POA: Diagnosis not present

## 2020-11-05 DIAGNOSIS — F419 Anxiety disorder, unspecified: Secondary | ICD-10-CM | POA: Diagnosis not present

## 2020-11-05 DIAGNOSIS — F1123 Opioid dependence with withdrawal: Secondary | ICD-10-CM | POA: Diagnosis not present

## 2020-11-05 DIAGNOSIS — F17200 Nicotine dependence, unspecified, uncomplicated: Secondary | ICD-10-CM | POA: Diagnosis not present

## 2020-11-19 DIAGNOSIS — F419 Anxiety disorder, unspecified: Secondary | ICD-10-CM | POA: Diagnosis not present

## 2020-11-19 DIAGNOSIS — F1123 Opioid dependence with withdrawal: Secondary | ICD-10-CM | POA: Diagnosis not present

## 2020-11-19 DIAGNOSIS — F17200 Nicotine dependence, unspecified, uncomplicated: Secondary | ICD-10-CM | POA: Diagnosis not present

## 2020-11-19 DIAGNOSIS — F329 Major depressive disorder, single episode, unspecified: Secondary | ICD-10-CM | POA: Diagnosis not present

## 2020-11-23 DIAGNOSIS — L42 Pityriasis rosea: Secondary | ICD-10-CM | POA: Diagnosis not present

## 2020-11-26 DIAGNOSIS — F17200 Nicotine dependence, unspecified, uncomplicated: Secondary | ICD-10-CM | POA: Diagnosis not present

## 2020-11-26 DIAGNOSIS — F329 Major depressive disorder, single episode, unspecified: Secondary | ICD-10-CM | POA: Diagnosis not present

## 2020-11-26 DIAGNOSIS — F419 Anxiety disorder, unspecified: Secondary | ICD-10-CM | POA: Diagnosis not present

## 2020-11-26 DIAGNOSIS — F1123 Opioid dependence with withdrawal: Secondary | ICD-10-CM | POA: Diagnosis not present

## 2020-12-03 DIAGNOSIS — F1123 Opioid dependence with withdrawal: Secondary | ICD-10-CM | POA: Diagnosis not present

## 2020-12-03 DIAGNOSIS — F329 Major depressive disorder, single episode, unspecified: Secondary | ICD-10-CM | POA: Diagnosis not present

## 2020-12-03 DIAGNOSIS — F17200 Nicotine dependence, unspecified, uncomplicated: Secondary | ICD-10-CM | POA: Diagnosis not present

## 2020-12-03 DIAGNOSIS — F419 Anxiety disorder, unspecified: Secondary | ICD-10-CM | POA: Diagnosis not present

## 2020-12-10 DIAGNOSIS — F329 Major depressive disorder, single episode, unspecified: Secondary | ICD-10-CM | POA: Diagnosis not present

## 2020-12-10 DIAGNOSIS — F1123 Opioid dependence with withdrawal: Secondary | ICD-10-CM | POA: Diagnosis not present

## 2020-12-10 DIAGNOSIS — F17200 Nicotine dependence, unspecified, uncomplicated: Secondary | ICD-10-CM | POA: Diagnosis not present

## 2020-12-10 DIAGNOSIS — F419 Anxiety disorder, unspecified: Secondary | ICD-10-CM | POA: Diagnosis not present

## 2020-12-17 DIAGNOSIS — F17200 Nicotine dependence, unspecified, uncomplicated: Secondary | ICD-10-CM | POA: Diagnosis not present

## 2020-12-17 DIAGNOSIS — F419 Anxiety disorder, unspecified: Secondary | ICD-10-CM | POA: Diagnosis not present

## 2020-12-17 DIAGNOSIS — F329 Major depressive disorder, single episode, unspecified: Secondary | ICD-10-CM | POA: Diagnosis not present

## 2020-12-17 DIAGNOSIS — F1123 Opioid dependence with withdrawal: Secondary | ICD-10-CM | POA: Diagnosis not present

## 2020-12-24 DIAGNOSIS — F419 Anxiety disorder, unspecified: Secondary | ICD-10-CM | POA: Diagnosis not present

## 2020-12-24 DIAGNOSIS — F329 Major depressive disorder, single episode, unspecified: Secondary | ICD-10-CM | POA: Diagnosis not present

## 2020-12-24 DIAGNOSIS — F1123 Opioid dependence with withdrawal: Secondary | ICD-10-CM | POA: Diagnosis not present

## 2020-12-24 DIAGNOSIS — F17200 Nicotine dependence, unspecified, uncomplicated: Secondary | ICD-10-CM | POA: Diagnosis not present

## 2020-12-31 DIAGNOSIS — F329 Major depressive disorder, single episode, unspecified: Secondary | ICD-10-CM | POA: Diagnosis not present

## 2020-12-31 DIAGNOSIS — F17200 Nicotine dependence, unspecified, uncomplicated: Secondary | ICD-10-CM | POA: Diagnosis not present

## 2020-12-31 DIAGNOSIS — F419 Anxiety disorder, unspecified: Secondary | ICD-10-CM | POA: Diagnosis not present

## 2020-12-31 DIAGNOSIS — F1123 Opioid dependence with withdrawal: Secondary | ICD-10-CM | POA: Diagnosis not present

## 2021-01-07 DIAGNOSIS — F1123 Opioid dependence with withdrawal: Secondary | ICD-10-CM | POA: Diagnosis not present

## 2021-01-07 DIAGNOSIS — F419 Anxiety disorder, unspecified: Secondary | ICD-10-CM | POA: Diagnosis not present

## 2021-01-07 DIAGNOSIS — F329 Major depressive disorder, single episode, unspecified: Secondary | ICD-10-CM | POA: Diagnosis not present

## 2021-01-07 DIAGNOSIS — F17200 Nicotine dependence, unspecified, uncomplicated: Secondary | ICD-10-CM | POA: Diagnosis not present

## 2021-01-14 DIAGNOSIS — F419 Anxiety disorder, unspecified: Secondary | ICD-10-CM | POA: Diagnosis not present

## 2021-01-14 DIAGNOSIS — F17200 Nicotine dependence, unspecified, uncomplicated: Secondary | ICD-10-CM | POA: Diagnosis not present

## 2021-01-14 DIAGNOSIS — F329 Major depressive disorder, single episode, unspecified: Secondary | ICD-10-CM | POA: Diagnosis not present

## 2021-01-14 DIAGNOSIS — F1123 Opioid dependence with withdrawal: Secondary | ICD-10-CM | POA: Diagnosis not present

## 2021-01-21 DIAGNOSIS — F1123 Opioid dependence with withdrawal: Secondary | ICD-10-CM | POA: Diagnosis not present

## 2021-01-21 DIAGNOSIS — F329 Major depressive disorder, single episode, unspecified: Secondary | ICD-10-CM | POA: Diagnosis not present

## 2021-01-21 DIAGNOSIS — F17200 Nicotine dependence, unspecified, uncomplicated: Secondary | ICD-10-CM | POA: Diagnosis not present

## 2021-01-21 DIAGNOSIS — F419 Anxiety disorder, unspecified: Secondary | ICD-10-CM | POA: Diagnosis not present

## 2021-01-28 DIAGNOSIS — F419 Anxiety disorder, unspecified: Secondary | ICD-10-CM | POA: Diagnosis not present

## 2021-01-28 DIAGNOSIS — F1123 Opioid dependence with withdrawal: Secondary | ICD-10-CM | POA: Diagnosis not present

## 2021-01-28 DIAGNOSIS — F329 Major depressive disorder, single episode, unspecified: Secondary | ICD-10-CM | POA: Diagnosis not present

## 2021-01-28 DIAGNOSIS — F17200 Nicotine dependence, unspecified, uncomplicated: Secondary | ICD-10-CM | POA: Diagnosis not present

## 2021-02-04 DIAGNOSIS — F1123 Opioid dependence with withdrawal: Secondary | ICD-10-CM | POA: Diagnosis not present

## 2021-02-04 DIAGNOSIS — F17200 Nicotine dependence, unspecified, uncomplicated: Secondary | ICD-10-CM | POA: Diagnosis not present

## 2021-02-04 DIAGNOSIS — F419 Anxiety disorder, unspecified: Secondary | ICD-10-CM | POA: Diagnosis not present

## 2021-02-04 DIAGNOSIS — F329 Major depressive disorder, single episode, unspecified: Secondary | ICD-10-CM | POA: Diagnosis not present

## 2021-02-18 DIAGNOSIS — F17200 Nicotine dependence, unspecified, uncomplicated: Secondary | ICD-10-CM | POA: Diagnosis not present

## 2021-02-18 DIAGNOSIS — F329 Major depressive disorder, single episode, unspecified: Secondary | ICD-10-CM | POA: Diagnosis not present

## 2021-02-18 DIAGNOSIS — F1123 Opioid dependence with withdrawal: Secondary | ICD-10-CM | POA: Diagnosis not present

## 2021-02-18 DIAGNOSIS — F419 Anxiety disorder, unspecified: Secondary | ICD-10-CM | POA: Diagnosis not present

## 2021-02-25 DIAGNOSIS — F419 Anxiety disorder, unspecified: Secondary | ICD-10-CM | POA: Diagnosis not present

## 2021-02-25 DIAGNOSIS — F329 Major depressive disorder, single episode, unspecified: Secondary | ICD-10-CM | POA: Diagnosis not present

## 2021-02-25 DIAGNOSIS — F17200 Nicotine dependence, unspecified, uncomplicated: Secondary | ICD-10-CM | POA: Diagnosis not present

## 2021-02-25 DIAGNOSIS — F1123 Opioid dependence with withdrawal: Secondary | ICD-10-CM | POA: Diagnosis not present

## 2021-03-11 DIAGNOSIS — F419 Anxiety disorder, unspecified: Secondary | ICD-10-CM | POA: Diagnosis not present

## 2021-03-11 DIAGNOSIS — F1123 Opioid dependence with withdrawal: Secondary | ICD-10-CM | POA: Diagnosis not present

## 2021-03-11 DIAGNOSIS — F17200 Nicotine dependence, unspecified, uncomplicated: Secondary | ICD-10-CM | POA: Diagnosis not present

## 2021-03-11 DIAGNOSIS — F329 Major depressive disorder, single episode, unspecified: Secondary | ICD-10-CM | POA: Diagnosis not present

## 2021-03-18 DIAGNOSIS — F17200 Nicotine dependence, unspecified, uncomplicated: Secondary | ICD-10-CM | POA: Diagnosis not present

## 2021-03-18 DIAGNOSIS — F329 Major depressive disorder, single episode, unspecified: Secondary | ICD-10-CM | POA: Diagnosis not present

## 2021-03-18 DIAGNOSIS — F1123 Opioid dependence with withdrawal: Secondary | ICD-10-CM | POA: Diagnosis not present

## 2021-03-18 DIAGNOSIS — F419 Anxiety disorder, unspecified: Secondary | ICD-10-CM | POA: Diagnosis not present

## 2021-03-25 DIAGNOSIS — F17200 Nicotine dependence, unspecified, uncomplicated: Secondary | ICD-10-CM | POA: Diagnosis not present

## 2021-03-25 DIAGNOSIS — F329 Major depressive disorder, single episode, unspecified: Secondary | ICD-10-CM | POA: Diagnosis not present

## 2021-03-25 DIAGNOSIS — F1123 Opioid dependence with withdrawal: Secondary | ICD-10-CM | POA: Diagnosis not present

## 2021-03-25 DIAGNOSIS — F419 Anxiety disorder, unspecified: Secondary | ICD-10-CM | POA: Diagnosis not present

## 2021-04-01 DIAGNOSIS — F419 Anxiety disorder, unspecified: Secondary | ICD-10-CM | POA: Diagnosis not present

## 2021-04-01 DIAGNOSIS — F17200 Nicotine dependence, unspecified, uncomplicated: Secondary | ICD-10-CM | POA: Diagnosis not present

## 2021-04-01 DIAGNOSIS — F1123 Opioid dependence with withdrawal: Secondary | ICD-10-CM | POA: Diagnosis not present

## 2021-04-01 DIAGNOSIS — F329 Major depressive disorder, single episode, unspecified: Secondary | ICD-10-CM | POA: Diagnosis not present

## 2021-04-08 DIAGNOSIS — F17200 Nicotine dependence, unspecified, uncomplicated: Secondary | ICD-10-CM | POA: Diagnosis not present

## 2021-04-08 DIAGNOSIS — F329 Major depressive disorder, single episode, unspecified: Secondary | ICD-10-CM | POA: Diagnosis not present

## 2021-04-08 DIAGNOSIS — F419 Anxiety disorder, unspecified: Secondary | ICD-10-CM | POA: Diagnosis not present

## 2021-04-08 DIAGNOSIS — F1123 Opioid dependence with withdrawal: Secondary | ICD-10-CM | POA: Diagnosis not present

## 2021-04-15 DIAGNOSIS — F419 Anxiety disorder, unspecified: Secondary | ICD-10-CM | POA: Diagnosis not present

## 2021-04-15 DIAGNOSIS — F1123 Opioid dependence with withdrawal: Secondary | ICD-10-CM | POA: Diagnosis not present

## 2021-04-15 DIAGNOSIS — F17200 Nicotine dependence, unspecified, uncomplicated: Secondary | ICD-10-CM | POA: Diagnosis not present

## 2021-04-15 DIAGNOSIS — F329 Major depressive disorder, single episode, unspecified: Secondary | ICD-10-CM | POA: Diagnosis not present

## 2021-04-24 DIAGNOSIS — N5312 Painful ejaculation: Secondary | ICD-10-CM | POA: Diagnosis not present

## 2021-04-24 DIAGNOSIS — R3915 Urgency of urination: Secondary | ICD-10-CM | POA: Diagnosis not present

## 2021-04-30 DIAGNOSIS — R509 Fever, unspecified: Secondary | ICD-10-CM | POA: Diagnosis not present

## 2021-04-30 DIAGNOSIS — R059 Cough, unspecified: Secondary | ICD-10-CM | POA: Diagnosis not present

## 2021-04-30 DIAGNOSIS — Z8709 Personal history of other diseases of the respiratory system: Secondary | ICD-10-CM | POA: Diagnosis not present

## 2021-04-30 DIAGNOSIS — U071 COVID-19: Secondary | ICD-10-CM | POA: Diagnosis not present

## 2021-05-06 DIAGNOSIS — F419 Anxiety disorder, unspecified: Secondary | ICD-10-CM | POA: Diagnosis not present

## 2021-05-06 DIAGNOSIS — F329 Major depressive disorder, single episode, unspecified: Secondary | ICD-10-CM | POA: Diagnosis not present

## 2021-05-06 DIAGNOSIS — F1123 Opioid dependence with withdrawal: Secondary | ICD-10-CM | POA: Diagnosis not present

## 2021-05-06 DIAGNOSIS — F17200 Nicotine dependence, unspecified, uncomplicated: Secondary | ICD-10-CM | POA: Diagnosis not present

## 2021-05-13 DIAGNOSIS — F419 Anxiety disorder, unspecified: Secondary | ICD-10-CM | POA: Diagnosis not present

## 2021-05-13 DIAGNOSIS — F329 Major depressive disorder, single episode, unspecified: Secondary | ICD-10-CM | POA: Diagnosis not present

## 2021-05-13 DIAGNOSIS — F1123 Opioid dependence with withdrawal: Secondary | ICD-10-CM | POA: Diagnosis not present

## 2021-05-13 DIAGNOSIS — F17200 Nicotine dependence, unspecified, uncomplicated: Secondary | ICD-10-CM | POA: Diagnosis not present

## 2021-05-20 DIAGNOSIS — F1123 Opioid dependence with withdrawal: Secondary | ICD-10-CM | POA: Diagnosis not present

## 2021-05-20 DIAGNOSIS — F329 Major depressive disorder, single episode, unspecified: Secondary | ICD-10-CM | POA: Diagnosis not present

## 2021-05-20 DIAGNOSIS — F17200 Nicotine dependence, unspecified, uncomplicated: Secondary | ICD-10-CM | POA: Diagnosis not present

## 2021-05-20 DIAGNOSIS — F419 Anxiety disorder, unspecified: Secondary | ICD-10-CM | POA: Diagnosis not present

## 2021-05-27 DIAGNOSIS — F17200 Nicotine dependence, unspecified, uncomplicated: Secondary | ICD-10-CM | POA: Diagnosis not present

## 2021-05-27 DIAGNOSIS — F419 Anxiety disorder, unspecified: Secondary | ICD-10-CM | POA: Diagnosis not present

## 2021-05-27 DIAGNOSIS — F329 Major depressive disorder, single episode, unspecified: Secondary | ICD-10-CM | POA: Diagnosis not present

## 2021-05-27 DIAGNOSIS — F1123 Opioid dependence with withdrawal: Secondary | ICD-10-CM | POA: Diagnosis not present

## 2021-06-03 DIAGNOSIS — F17200 Nicotine dependence, unspecified, uncomplicated: Secondary | ICD-10-CM | POA: Diagnosis not present

## 2021-06-03 DIAGNOSIS — F1123 Opioid dependence with withdrawal: Secondary | ICD-10-CM | POA: Diagnosis not present

## 2021-06-03 DIAGNOSIS — F329 Major depressive disorder, single episode, unspecified: Secondary | ICD-10-CM | POA: Diagnosis not present

## 2021-06-03 DIAGNOSIS — F419 Anxiety disorder, unspecified: Secondary | ICD-10-CM | POA: Diagnosis not present

## 2021-06-24 DIAGNOSIS — F329 Major depressive disorder, single episode, unspecified: Secondary | ICD-10-CM | POA: Diagnosis not present

## 2021-06-24 DIAGNOSIS — F1123 Opioid dependence with withdrawal: Secondary | ICD-10-CM | POA: Diagnosis not present

## 2021-06-24 DIAGNOSIS — F419 Anxiety disorder, unspecified: Secondary | ICD-10-CM | POA: Diagnosis not present

## 2021-06-24 DIAGNOSIS — F17200 Nicotine dependence, unspecified, uncomplicated: Secondary | ICD-10-CM | POA: Diagnosis not present

## 2021-07-08 DIAGNOSIS — F329 Major depressive disorder, single episode, unspecified: Secondary | ICD-10-CM | POA: Diagnosis not present

## 2021-07-08 DIAGNOSIS — F1123 Opioid dependence with withdrawal: Secondary | ICD-10-CM | POA: Diagnosis not present

## 2021-07-08 DIAGNOSIS — F17200 Nicotine dependence, unspecified, uncomplicated: Secondary | ICD-10-CM | POA: Diagnosis not present

## 2021-07-08 DIAGNOSIS — F419 Anxiety disorder, unspecified: Secondary | ICD-10-CM | POA: Diagnosis not present

## 2021-07-15 DIAGNOSIS — F329 Major depressive disorder, single episode, unspecified: Secondary | ICD-10-CM | POA: Diagnosis not present

## 2021-07-15 DIAGNOSIS — F17200 Nicotine dependence, unspecified, uncomplicated: Secondary | ICD-10-CM | POA: Diagnosis not present

## 2021-07-15 DIAGNOSIS — F419 Anxiety disorder, unspecified: Secondary | ICD-10-CM | POA: Diagnosis not present

## 2021-07-15 DIAGNOSIS — F1123 Opioid dependence with withdrawal: Secondary | ICD-10-CM | POA: Diagnosis not present

## 2021-07-22 DIAGNOSIS — F419 Anxiety disorder, unspecified: Secondary | ICD-10-CM | POA: Diagnosis not present

## 2021-07-22 DIAGNOSIS — F329 Major depressive disorder, single episode, unspecified: Secondary | ICD-10-CM | POA: Diagnosis not present

## 2021-07-22 DIAGNOSIS — F1123 Opioid dependence with withdrawal: Secondary | ICD-10-CM | POA: Diagnosis not present

## 2021-07-22 DIAGNOSIS — F17200 Nicotine dependence, unspecified, uncomplicated: Secondary | ICD-10-CM | POA: Diagnosis not present

## 2021-07-29 DIAGNOSIS — F419 Anxiety disorder, unspecified: Secondary | ICD-10-CM | POA: Diagnosis not present

## 2021-07-29 DIAGNOSIS — F329 Major depressive disorder, single episode, unspecified: Secondary | ICD-10-CM | POA: Diagnosis not present

## 2021-07-29 DIAGNOSIS — F17200 Nicotine dependence, unspecified, uncomplicated: Secondary | ICD-10-CM | POA: Diagnosis not present

## 2021-07-29 DIAGNOSIS — F1123 Opioid dependence with withdrawal: Secondary | ICD-10-CM | POA: Diagnosis not present

## 2021-08-05 DIAGNOSIS — F1123 Opioid dependence with withdrawal: Secondary | ICD-10-CM | POA: Diagnosis not present

## 2021-08-05 DIAGNOSIS — F329 Major depressive disorder, single episode, unspecified: Secondary | ICD-10-CM | POA: Diagnosis not present

## 2021-08-05 DIAGNOSIS — F17200 Nicotine dependence, unspecified, uncomplicated: Secondary | ICD-10-CM | POA: Diagnosis not present

## 2021-08-05 DIAGNOSIS — F419 Anxiety disorder, unspecified: Secondary | ICD-10-CM | POA: Diagnosis not present

## 2021-08-12 DIAGNOSIS — F419 Anxiety disorder, unspecified: Secondary | ICD-10-CM | POA: Diagnosis not present

## 2021-08-12 DIAGNOSIS — F17200 Nicotine dependence, unspecified, uncomplicated: Secondary | ICD-10-CM | POA: Diagnosis not present

## 2021-08-12 DIAGNOSIS — F329 Major depressive disorder, single episode, unspecified: Secondary | ICD-10-CM | POA: Diagnosis not present

## 2021-08-12 DIAGNOSIS — F1123 Opioid dependence with withdrawal: Secondary | ICD-10-CM | POA: Diagnosis not present

## 2021-08-19 DIAGNOSIS — F329 Major depressive disorder, single episode, unspecified: Secondary | ICD-10-CM | POA: Diagnosis not present

## 2021-08-19 DIAGNOSIS — F419 Anxiety disorder, unspecified: Secondary | ICD-10-CM | POA: Diagnosis not present

## 2021-08-19 DIAGNOSIS — F1123 Opioid dependence with withdrawal: Secondary | ICD-10-CM | POA: Diagnosis not present

## 2021-08-19 DIAGNOSIS — F17200 Nicotine dependence, unspecified, uncomplicated: Secondary | ICD-10-CM | POA: Diagnosis not present

## 2021-08-26 DIAGNOSIS — F17200 Nicotine dependence, unspecified, uncomplicated: Secondary | ICD-10-CM | POA: Diagnosis not present

## 2021-08-26 DIAGNOSIS — F419 Anxiety disorder, unspecified: Secondary | ICD-10-CM | POA: Diagnosis not present

## 2021-08-26 DIAGNOSIS — F1123 Opioid dependence with withdrawal: Secondary | ICD-10-CM | POA: Diagnosis not present

## 2021-08-26 DIAGNOSIS — F329 Major depressive disorder, single episode, unspecified: Secondary | ICD-10-CM | POA: Diagnosis not present

## 2021-09-02 DIAGNOSIS — F1123 Opioid dependence with withdrawal: Secondary | ICD-10-CM | POA: Diagnosis not present

## 2021-09-02 DIAGNOSIS — F17200 Nicotine dependence, unspecified, uncomplicated: Secondary | ICD-10-CM | POA: Diagnosis not present

## 2021-09-02 DIAGNOSIS — F329 Major depressive disorder, single episode, unspecified: Secondary | ICD-10-CM | POA: Diagnosis not present

## 2021-09-02 DIAGNOSIS — F419 Anxiety disorder, unspecified: Secondary | ICD-10-CM | POA: Diagnosis not present

## 2021-09-09 DIAGNOSIS — F1123 Opioid dependence with withdrawal: Secondary | ICD-10-CM | POA: Diagnosis not present

## 2021-09-09 DIAGNOSIS — F419 Anxiety disorder, unspecified: Secondary | ICD-10-CM | POA: Diagnosis not present

## 2021-09-09 DIAGNOSIS — F329 Major depressive disorder, single episode, unspecified: Secondary | ICD-10-CM | POA: Diagnosis not present

## 2021-09-09 DIAGNOSIS — F17200 Nicotine dependence, unspecified, uncomplicated: Secondary | ICD-10-CM | POA: Diagnosis not present

## 2021-09-16 DIAGNOSIS — F329 Major depressive disorder, single episode, unspecified: Secondary | ICD-10-CM | POA: Diagnosis not present

## 2021-09-16 DIAGNOSIS — F17200 Nicotine dependence, unspecified, uncomplicated: Secondary | ICD-10-CM | POA: Diagnosis not present

## 2021-09-16 DIAGNOSIS — F1123 Opioid dependence with withdrawal: Secondary | ICD-10-CM | POA: Diagnosis not present

## 2021-09-16 DIAGNOSIS — F419 Anxiety disorder, unspecified: Secondary | ICD-10-CM | POA: Diagnosis not present

## 2021-09-30 DIAGNOSIS — F329 Major depressive disorder, single episode, unspecified: Secondary | ICD-10-CM | POA: Diagnosis not present

## 2021-09-30 DIAGNOSIS — F419 Anxiety disorder, unspecified: Secondary | ICD-10-CM | POA: Diagnosis not present

## 2021-09-30 DIAGNOSIS — F17200 Nicotine dependence, unspecified, uncomplicated: Secondary | ICD-10-CM | POA: Diagnosis not present

## 2021-09-30 DIAGNOSIS — F1123 Opioid dependence with withdrawal: Secondary | ICD-10-CM | POA: Diagnosis not present

## 2021-10-07 DIAGNOSIS — F1123 Opioid dependence with withdrawal: Secondary | ICD-10-CM | POA: Diagnosis not present

## 2021-10-07 DIAGNOSIS — F17200 Nicotine dependence, unspecified, uncomplicated: Secondary | ICD-10-CM | POA: Diagnosis not present

## 2021-10-07 DIAGNOSIS — F329 Major depressive disorder, single episode, unspecified: Secondary | ICD-10-CM | POA: Diagnosis not present

## 2021-10-07 DIAGNOSIS — F419 Anxiety disorder, unspecified: Secondary | ICD-10-CM | POA: Diagnosis not present

## 2021-11-04 DIAGNOSIS — F1123 Opioid dependence with withdrawal: Secondary | ICD-10-CM | POA: Diagnosis not present

## 2021-11-04 DIAGNOSIS — F419 Anxiety disorder, unspecified: Secondary | ICD-10-CM | POA: Diagnosis not present

## 2021-11-04 DIAGNOSIS — F329 Major depressive disorder, single episode, unspecified: Secondary | ICD-10-CM | POA: Diagnosis not present

## 2021-11-04 DIAGNOSIS — F17200 Nicotine dependence, unspecified, uncomplicated: Secondary | ICD-10-CM | POA: Diagnosis not present

## 2021-11-11 DIAGNOSIS — F419 Anxiety disorder, unspecified: Secondary | ICD-10-CM | POA: Diagnosis not present

## 2021-11-11 DIAGNOSIS — F329 Major depressive disorder, single episode, unspecified: Secondary | ICD-10-CM | POA: Diagnosis not present

## 2021-11-11 DIAGNOSIS — F17200 Nicotine dependence, unspecified, uncomplicated: Secondary | ICD-10-CM | POA: Diagnosis not present

## 2021-11-11 DIAGNOSIS — F1123 Opioid dependence with withdrawal: Secondary | ICD-10-CM | POA: Diagnosis not present

## 2021-11-18 DIAGNOSIS — F17200 Nicotine dependence, unspecified, uncomplicated: Secondary | ICD-10-CM | POA: Diagnosis not present

## 2021-11-18 DIAGNOSIS — F1123 Opioid dependence with withdrawal: Secondary | ICD-10-CM | POA: Diagnosis not present

## 2021-11-18 DIAGNOSIS — F329 Major depressive disorder, single episode, unspecified: Secondary | ICD-10-CM | POA: Diagnosis not present

## 2021-11-18 DIAGNOSIS — F419 Anxiety disorder, unspecified: Secondary | ICD-10-CM | POA: Diagnosis not present

## 2021-11-25 DIAGNOSIS — F329 Major depressive disorder, single episode, unspecified: Secondary | ICD-10-CM | POA: Diagnosis not present

## 2021-11-25 DIAGNOSIS — F17200 Nicotine dependence, unspecified, uncomplicated: Secondary | ICD-10-CM | POA: Diagnosis not present

## 2021-11-25 DIAGNOSIS — F1123 Opioid dependence with withdrawal: Secondary | ICD-10-CM | POA: Diagnosis not present

## 2021-11-25 DIAGNOSIS — F419 Anxiety disorder, unspecified: Secondary | ICD-10-CM | POA: Diagnosis not present

## 2021-12-09 DIAGNOSIS — F17200 Nicotine dependence, unspecified, uncomplicated: Secondary | ICD-10-CM | POA: Diagnosis not present

## 2021-12-09 DIAGNOSIS — F1123 Opioid dependence with withdrawal: Secondary | ICD-10-CM | POA: Diagnosis not present

## 2021-12-09 DIAGNOSIS — F419 Anxiety disorder, unspecified: Secondary | ICD-10-CM | POA: Diagnosis not present

## 2021-12-09 DIAGNOSIS — F329 Major depressive disorder, single episode, unspecified: Secondary | ICD-10-CM | POA: Diagnosis not present

## 2021-12-16 DIAGNOSIS — F329 Major depressive disorder, single episode, unspecified: Secondary | ICD-10-CM | POA: Diagnosis not present

## 2021-12-16 DIAGNOSIS — F419 Anxiety disorder, unspecified: Secondary | ICD-10-CM | POA: Diagnosis not present

## 2021-12-16 DIAGNOSIS — F1123 Opioid dependence with withdrawal: Secondary | ICD-10-CM | POA: Diagnosis not present

## 2021-12-16 DIAGNOSIS — F17200 Nicotine dependence, unspecified, uncomplicated: Secondary | ICD-10-CM | POA: Diagnosis not present

## 2021-12-23 DIAGNOSIS — F17200 Nicotine dependence, unspecified, uncomplicated: Secondary | ICD-10-CM | POA: Diagnosis not present

## 2021-12-23 DIAGNOSIS — F329 Major depressive disorder, single episode, unspecified: Secondary | ICD-10-CM | POA: Diagnosis not present

## 2021-12-23 DIAGNOSIS — F419 Anxiety disorder, unspecified: Secondary | ICD-10-CM | POA: Diagnosis not present

## 2021-12-23 DIAGNOSIS — F1123 Opioid dependence with withdrawal: Secondary | ICD-10-CM | POA: Diagnosis not present

## 2021-12-30 DIAGNOSIS — F419 Anxiety disorder, unspecified: Secondary | ICD-10-CM | POA: Diagnosis not present

## 2021-12-30 DIAGNOSIS — F329 Major depressive disorder, single episode, unspecified: Secondary | ICD-10-CM | POA: Diagnosis not present

## 2021-12-30 DIAGNOSIS — F17200 Nicotine dependence, unspecified, uncomplicated: Secondary | ICD-10-CM | POA: Diagnosis not present

## 2021-12-30 DIAGNOSIS — F1123 Opioid dependence with withdrawal: Secondary | ICD-10-CM | POA: Diagnosis not present

## 2022-01-06 DIAGNOSIS — F17200 Nicotine dependence, unspecified, uncomplicated: Secondary | ICD-10-CM | POA: Diagnosis not present

## 2022-01-06 DIAGNOSIS — F1123 Opioid dependence with withdrawal: Secondary | ICD-10-CM | POA: Diagnosis not present

## 2022-01-06 DIAGNOSIS — F419 Anxiety disorder, unspecified: Secondary | ICD-10-CM | POA: Diagnosis not present

## 2022-01-06 DIAGNOSIS — F329 Major depressive disorder, single episode, unspecified: Secondary | ICD-10-CM | POA: Diagnosis not present

## 2022-01-13 DIAGNOSIS — F1123 Opioid dependence with withdrawal: Secondary | ICD-10-CM | POA: Diagnosis not present

## 2022-01-13 DIAGNOSIS — F17200 Nicotine dependence, unspecified, uncomplicated: Secondary | ICD-10-CM | POA: Diagnosis not present

## 2022-01-13 DIAGNOSIS — F329 Major depressive disorder, single episode, unspecified: Secondary | ICD-10-CM | POA: Diagnosis not present

## 2022-01-13 DIAGNOSIS — F419 Anxiety disorder, unspecified: Secondary | ICD-10-CM | POA: Diagnosis not present

## 2022-01-20 DIAGNOSIS — F419 Anxiety disorder, unspecified: Secondary | ICD-10-CM | POA: Diagnosis not present

## 2022-01-20 DIAGNOSIS — F17200 Nicotine dependence, unspecified, uncomplicated: Secondary | ICD-10-CM | POA: Diagnosis not present

## 2022-01-20 DIAGNOSIS — F1123 Opioid dependence with withdrawal: Secondary | ICD-10-CM | POA: Diagnosis not present

## 2022-01-20 DIAGNOSIS — F329 Major depressive disorder, single episode, unspecified: Secondary | ICD-10-CM | POA: Diagnosis not present

## 2022-01-27 DIAGNOSIS — F419 Anxiety disorder, unspecified: Secondary | ICD-10-CM | POA: Diagnosis not present

## 2022-01-27 DIAGNOSIS — F17200 Nicotine dependence, unspecified, uncomplicated: Secondary | ICD-10-CM | POA: Diagnosis not present

## 2022-01-27 DIAGNOSIS — F329 Major depressive disorder, single episode, unspecified: Secondary | ICD-10-CM | POA: Diagnosis not present

## 2022-01-27 DIAGNOSIS — F1123 Opioid dependence with withdrawal: Secondary | ICD-10-CM | POA: Diagnosis not present

## 2022-02-03 DIAGNOSIS — F1123 Opioid dependence with withdrawal: Secondary | ICD-10-CM | POA: Diagnosis not present

## 2022-02-03 DIAGNOSIS — F419 Anxiety disorder, unspecified: Secondary | ICD-10-CM | POA: Diagnosis not present

## 2022-02-03 DIAGNOSIS — F17200 Nicotine dependence, unspecified, uncomplicated: Secondary | ICD-10-CM | POA: Diagnosis not present

## 2022-02-03 DIAGNOSIS — F329 Major depressive disorder, single episode, unspecified: Secondary | ICD-10-CM | POA: Diagnosis not present

## 2022-02-10 DIAGNOSIS — F419 Anxiety disorder, unspecified: Secondary | ICD-10-CM | POA: Diagnosis not present

## 2022-02-10 DIAGNOSIS — F1123 Opioid dependence with withdrawal: Secondary | ICD-10-CM | POA: Diagnosis not present

## 2022-02-10 DIAGNOSIS — F17200 Nicotine dependence, unspecified, uncomplicated: Secondary | ICD-10-CM | POA: Diagnosis not present

## 2022-02-10 DIAGNOSIS — F329 Major depressive disorder, single episode, unspecified: Secondary | ICD-10-CM | POA: Diagnosis not present

## 2022-02-17 DIAGNOSIS — F419 Anxiety disorder, unspecified: Secondary | ICD-10-CM | POA: Diagnosis not present

## 2022-02-17 DIAGNOSIS — F329 Major depressive disorder, single episode, unspecified: Secondary | ICD-10-CM | POA: Diagnosis not present

## 2022-02-17 DIAGNOSIS — F1123 Opioid dependence with withdrawal: Secondary | ICD-10-CM | POA: Diagnosis not present

## 2022-02-17 DIAGNOSIS — F17200 Nicotine dependence, unspecified, uncomplicated: Secondary | ICD-10-CM | POA: Diagnosis not present

## 2022-02-24 DIAGNOSIS — F1123 Opioid dependence with withdrawal: Secondary | ICD-10-CM | POA: Diagnosis not present

## 2022-02-24 DIAGNOSIS — F17200 Nicotine dependence, unspecified, uncomplicated: Secondary | ICD-10-CM | POA: Diagnosis not present

## 2022-02-24 DIAGNOSIS — F329 Major depressive disorder, single episode, unspecified: Secondary | ICD-10-CM | POA: Diagnosis not present

## 2022-02-24 DIAGNOSIS — F419 Anxiety disorder, unspecified: Secondary | ICD-10-CM | POA: Diagnosis not present

## 2022-03-03 DIAGNOSIS — F419 Anxiety disorder, unspecified: Secondary | ICD-10-CM | POA: Diagnosis not present

## 2022-03-03 DIAGNOSIS — F17200 Nicotine dependence, unspecified, uncomplicated: Secondary | ICD-10-CM | POA: Diagnosis not present

## 2022-03-03 DIAGNOSIS — F329 Major depressive disorder, single episode, unspecified: Secondary | ICD-10-CM | POA: Diagnosis not present

## 2022-03-03 DIAGNOSIS — F1123 Opioid dependence with withdrawal: Secondary | ICD-10-CM | POA: Diagnosis not present

## 2022-03-10 DIAGNOSIS — F17200 Nicotine dependence, unspecified, uncomplicated: Secondary | ICD-10-CM | POA: Diagnosis not present

## 2022-03-10 DIAGNOSIS — F329 Major depressive disorder, single episode, unspecified: Secondary | ICD-10-CM | POA: Diagnosis not present

## 2022-03-10 DIAGNOSIS — F1123 Opioid dependence with withdrawal: Secondary | ICD-10-CM | POA: Diagnosis not present

## 2022-03-10 DIAGNOSIS — F419 Anxiety disorder, unspecified: Secondary | ICD-10-CM | POA: Diagnosis not present

## 2022-03-17 DIAGNOSIS — F329 Major depressive disorder, single episode, unspecified: Secondary | ICD-10-CM | POA: Diagnosis not present

## 2022-03-17 DIAGNOSIS — F1123 Opioid dependence with withdrawal: Secondary | ICD-10-CM | POA: Diagnosis not present

## 2022-03-17 DIAGNOSIS — F419 Anxiety disorder, unspecified: Secondary | ICD-10-CM | POA: Diagnosis not present

## 2022-03-17 DIAGNOSIS — F17200 Nicotine dependence, unspecified, uncomplicated: Secondary | ICD-10-CM | POA: Diagnosis not present

## 2022-03-31 DIAGNOSIS — F419 Anxiety disorder, unspecified: Secondary | ICD-10-CM | POA: Diagnosis not present

## 2022-03-31 DIAGNOSIS — F17200 Nicotine dependence, unspecified, uncomplicated: Secondary | ICD-10-CM | POA: Diagnosis not present

## 2022-03-31 DIAGNOSIS — F1123 Opioid dependence with withdrawal: Secondary | ICD-10-CM | POA: Diagnosis not present

## 2022-03-31 DIAGNOSIS — F329 Major depressive disorder, single episode, unspecified: Secondary | ICD-10-CM | POA: Diagnosis not present

## 2022-04-07 DIAGNOSIS — F419 Anxiety disorder, unspecified: Secondary | ICD-10-CM | POA: Diagnosis not present

## 2022-04-07 DIAGNOSIS — F329 Major depressive disorder, single episode, unspecified: Secondary | ICD-10-CM | POA: Diagnosis not present

## 2022-04-07 DIAGNOSIS — F1123 Opioid dependence with withdrawal: Secondary | ICD-10-CM | POA: Diagnosis not present

## 2022-04-07 DIAGNOSIS — F17200 Nicotine dependence, unspecified, uncomplicated: Secondary | ICD-10-CM | POA: Diagnosis not present

## 2022-04-14 DIAGNOSIS — F17200 Nicotine dependence, unspecified, uncomplicated: Secondary | ICD-10-CM | POA: Diagnosis not present

## 2022-04-14 DIAGNOSIS — F419 Anxiety disorder, unspecified: Secondary | ICD-10-CM | POA: Diagnosis not present

## 2022-04-14 DIAGNOSIS — F329 Major depressive disorder, single episode, unspecified: Secondary | ICD-10-CM | POA: Diagnosis not present

## 2022-04-14 DIAGNOSIS — F1123 Opioid dependence with withdrawal: Secondary | ICD-10-CM | POA: Diagnosis not present

## 2022-04-21 DIAGNOSIS — F329 Major depressive disorder, single episode, unspecified: Secondary | ICD-10-CM | POA: Diagnosis not present

## 2022-04-21 DIAGNOSIS — F17200 Nicotine dependence, unspecified, uncomplicated: Secondary | ICD-10-CM | POA: Diagnosis not present

## 2022-04-21 DIAGNOSIS — F419 Anxiety disorder, unspecified: Secondary | ICD-10-CM | POA: Diagnosis not present

## 2022-04-21 DIAGNOSIS — F1123 Opioid dependence with withdrawal: Secondary | ICD-10-CM | POA: Diagnosis not present

## 2022-05-05 DIAGNOSIS — F1123 Opioid dependence with withdrawal: Secondary | ICD-10-CM | POA: Diagnosis not present

## 2022-05-05 DIAGNOSIS — F329 Major depressive disorder, single episode, unspecified: Secondary | ICD-10-CM | POA: Diagnosis not present

## 2022-05-05 DIAGNOSIS — F419 Anxiety disorder, unspecified: Secondary | ICD-10-CM | POA: Diagnosis not present

## 2022-05-05 DIAGNOSIS — F17200 Nicotine dependence, unspecified, uncomplicated: Secondary | ICD-10-CM | POA: Diagnosis not present

## 2022-05-12 DIAGNOSIS — F329 Major depressive disorder, single episode, unspecified: Secondary | ICD-10-CM | POA: Diagnosis not present

## 2022-05-12 DIAGNOSIS — F1123 Opioid dependence with withdrawal: Secondary | ICD-10-CM | POA: Diagnosis not present

## 2022-05-12 DIAGNOSIS — F419 Anxiety disorder, unspecified: Secondary | ICD-10-CM | POA: Diagnosis not present

## 2022-05-12 DIAGNOSIS — F17200 Nicotine dependence, unspecified, uncomplicated: Secondary | ICD-10-CM | POA: Diagnosis not present

## 2022-05-19 DIAGNOSIS — F1123 Opioid dependence with withdrawal: Secondary | ICD-10-CM | POA: Diagnosis not present

## 2022-05-19 DIAGNOSIS — F419 Anxiety disorder, unspecified: Secondary | ICD-10-CM | POA: Diagnosis not present

## 2022-05-19 DIAGNOSIS — F329 Major depressive disorder, single episode, unspecified: Secondary | ICD-10-CM | POA: Diagnosis not present

## 2022-05-19 DIAGNOSIS — F17200 Nicotine dependence, unspecified, uncomplicated: Secondary | ICD-10-CM | POA: Diagnosis not present

## 2022-06-09 DIAGNOSIS — F419 Anxiety disorder, unspecified: Secondary | ICD-10-CM | POA: Diagnosis not present

## 2022-06-09 DIAGNOSIS — F1123 Opioid dependence with withdrawal: Secondary | ICD-10-CM | POA: Diagnosis not present

## 2022-06-09 DIAGNOSIS — F17200 Nicotine dependence, unspecified, uncomplicated: Secondary | ICD-10-CM | POA: Diagnosis not present

## 2022-06-09 DIAGNOSIS — F329 Major depressive disorder, single episode, unspecified: Secondary | ICD-10-CM | POA: Diagnosis not present

## 2022-06-16 DIAGNOSIS — F17200 Nicotine dependence, unspecified, uncomplicated: Secondary | ICD-10-CM | POA: Diagnosis not present

## 2022-06-16 DIAGNOSIS — F419 Anxiety disorder, unspecified: Secondary | ICD-10-CM | POA: Diagnosis not present

## 2022-06-16 DIAGNOSIS — F1123 Opioid dependence with withdrawal: Secondary | ICD-10-CM | POA: Diagnosis not present

## 2022-06-16 DIAGNOSIS — F329 Major depressive disorder, single episode, unspecified: Secondary | ICD-10-CM | POA: Diagnosis not present

## 2022-06-23 DIAGNOSIS — F419 Anxiety disorder, unspecified: Secondary | ICD-10-CM | POA: Diagnosis not present

## 2022-06-23 DIAGNOSIS — F17200 Nicotine dependence, unspecified, uncomplicated: Secondary | ICD-10-CM | POA: Diagnosis not present

## 2022-06-23 DIAGNOSIS — F1123 Opioid dependence with withdrawal: Secondary | ICD-10-CM | POA: Diagnosis not present

## 2022-06-23 DIAGNOSIS — F329 Major depressive disorder, single episode, unspecified: Secondary | ICD-10-CM | POA: Diagnosis not present

## 2022-07-07 DIAGNOSIS — F1123 Opioid dependence with withdrawal: Secondary | ICD-10-CM | POA: Diagnosis not present

## 2022-07-07 DIAGNOSIS — F329 Major depressive disorder, single episode, unspecified: Secondary | ICD-10-CM | POA: Diagnosis not present

## 2022-07-07 DIAGNOSIS — F17200 Nicotine dependence, unspecified, uncomplicated: Secondary | ICD-10-CM | POA: Diagnosis not present

## 2022-07-07 DIAGNOSIS — F419 Anxiety disorder, unspecified: Secondary | ICD-10-CM | POA: Diagnosis not present

## 2022-07-14 DIAGNOSIS — F329 Major depressive disorder, single episode, unspecified: Secondary | ICD-10-CM | POA: Diagnosis not present

## 2022-07-14 DIAGNOSIS — F1123 Opioid dependence with withdrawal: Secondary | ICD-10-CM | POA: Diagnosis not present

## 2022-07-14 DIAGNOSIS — F419 Anxiety disorder, unspecified: Secondary | ICD-10-CM | POA: Diagnosis not present

## 2022-07-14 DIAGNOSIS — F17200 Nicotine dependence, unspecified, uncomplicated: Secondary | ICD-10-CM | POA: Diagnosis not present

## 2022-07-21 DIAGNOSIS — F329 Major depressive disorder, single episode, unspecified: Secondary | ICD-10-CM | POA: Diagnosis not present

## 2022-07-21 DIAGNOSIS — F17200 Nicotine dependence, unspecified, uncomplicated: Secondary | ICD-10-CM | POA: Diagnosis not present

## 2022-07-21 DIAGNOSIS — F419 Anxiety disorder, unspecified: Secondary | ICD-10-CM | POA: Diagnosis not present

## 2022-07-21 DIAGNOSIS — F1123 Opioid dependence with withdrawal: Secondary | ICD-10-CM | POA: Diagnosis not present

## 2022-07-28 DIAGNOSIS — F17200 Nicotine dependence, unspecified, uncomplicated: Secondary | ICD-10-CM | POA: Diagnosis not present

## 2022-07-28 DIAGNOSIS — F419 Anxiety disorder, unspecified: Secondary | ICD-10-CM | POA: Diagnosis not present

## 2022-07-28 DIAGNOSIS — F329 Major depressive disorder, single episode, unspecified: Secondary | ICD-10-CM | POA: Diagnosis not present

## 2022-07-28 DIAGNOSIS — F1123 Opioid dependence with withdrawal: Secondary | ICD-10-CM | POA: Diagnosis not present

## 2022-08-04 DIAGNOSIS — F329 Major depressive disorder, single episode, unspecified: Secondary | ICD-10-CM | POA: Diagnosis not present

## 2022-08-04 DIAGNOSIS — F17200 Nicotine dependence, unspecified, uncomplicated: Secondary | ICD-10-CM | POA: Diagnosis not present

## 2022-08-04 DIAGNOSIS — F419 Anxiety disorder, unspecified: Secondary | ICD-10-CM | POA: Diagnosis not present

## 2022-08-04 DIAGNOSIS — F1123 Opioid dependence with withdrawal: Secondary | ICD-10-CM | POA: Diagnosis not present

## 2022-08-11 DIAGNOSIS — F17200 Nicotine dependence, unspecified, uncomplicated: Secondary | ICD-10-CM | POA: Diagnosis not present

## 2022-08-11 DIAGNOSIS — F1123 Opioid dependence with withdrawal: Secondary | ICD-10-CM | POA: Diagnosis not present

## 2022-08-11 DIAGNOSIS — F329 Major depressive disorder, single episode, unspecified: Secondary | ICD-10-CM | POA: Diagnosis not present

## 2022-08-11 DIAGNOSIS — F419 Anxiety disorder, unspecified: Secondary | ICD-10-CM | POA: Diagnosis not present

## 2022-08-18 DIAGNOSIS — F17200 Nicotine dependence, unspecified, uncomplicated: Secondary | ICD-10-CM | POA: Diagnosis not present

## 2022-08-18 DIAGNOSIS — F1123 Opioid dependence with withdrawal: Secondary | ICD-10-CM | POA: Diagnosis not present

## 2022-08-18 DIAGNOSIS — F419 Anxiety disorder, unspecified: Secondary | ICD-10-CM | POA: Diagnosis not present

## 2022-08-18 DIAGNOSIS — F329 Major depressive disorder, single episode, unspecified: Secondary | ICD-10-CM | POA: Diagnosis not present

## 2022-08-25 DIAGNOSIS — F329 Major depressive disorder, single episode, unspecified: Secondary | ICD-10-CM | POA: Diagnosis not present

## 2022-08-25 DIAGNOSIS — F17200 Nicotine dependence, unspecified, uncomplicated: Secondary | ICD-10-CM | POA: Diagnosis not present

## 2022-08-25 DIAGNOSIS — F1123 Opioid dependence with withdrawal: Secondary | ICD-10-CM | POA: Diagnosis not present

## 2022-08-25 DIAGNOSIS — F419 Anxiety disorder, unspecified: Secondary | ICD-10-CM | POA: Diagnosis not present

## 2022-09-01 DIAGNOSIS — F419 Anxiety disorder, unspecified: Secondary | ICD-10-CM | POA: Diagnosis not present

## 2022-09-01 DIAGNOSIS — F17200 Nicotine dependence, unspecified, uncomplicated: Secondary | ICD-10-CM | POA: Diagnosis not present

## 2022-09-01 DIAGNOSIS — F329 Major depressive disorder, single episode, unspecified: Secondary | ICD-10-CM | POA: Diagnosis not present

## 2022-09-01 DIAGNOSIS — F1123 Opioid dependence with withdrawal: Secondary | ICD-10-CM | POA: Diagnosis not present

## 2022-09-08 DIAGNOSIS — F329 Major depressive disorder, single episode, unspecified: Secondary | ICD-10-CM | POA: Diagnosis not present

## 2022-09-08 DIAGNOSIS — F17200 Nicotine dependence, unspecified, uncomplicated: Secondary | ICD-10-CM | POA: Diagnosis not present

## 2022-09-08 DIAGNOSIS — F1123 Opioid dependence with withdrawal: Secondary | ICD-10-CM | POA: Diagnosis not present

## 2022-09-08 DIAGNOSIS — F419 Anxiety disorder, unspecified: Secondary | ICD-10-CM | POA: Diagnosis not present

## 2022-09-15 DIAGNOSIS — F419 Anxiety disorder, unspecified: Secondary | ICD-10-CM | POA: Diagnosis not present

## 2022-09-15 DIAGNOSIS — F329 Major depressive disorder, single episode, unspecified: Secondary | ICD-10-CM | POA: Diagnosis not present

## 2022-09-15 DIAGNOSIS — F17200 Nicotine dependence, unspecified, uncomplicated: Secondary | ICD-10-CM | POA: Diagnosis not present

## 2022-09-15 DIAGNOSIS — F1123 Opioid dependence with withdrawal: Secondary | ICD-10-CM | POA: Diagnosis not present

## 2022-10-06 DIAGNOSIS — F329 Major depressive disorder, single episode, unspecified: Secondary | ICD-10-CM | POA: Diagnosis not present

## 2022-10-06 DIAGNOSIS — F17200 Nicotine dependence, unspecified, uncomplicated: Secondary | ICD-10-CM | POA: Diagnosis not present

## 2022-10-06 DIAGNOSIS — F1123 Opioid dependence with withdrawal: Secondary | ICD-10-CM | POA: Diagnosis not present

## 2022-10-06 DIAGNOSIS — F419 Anxiety disorder, unspecified: Secondary | ICD-10-CM | POA: Diagnosis not present

## 2023-06-15 ENCOUNTER — Ambulatory Visit (HOSPITAL_COMMUNITY): Payer: Self-pay
